# Patient Record
Sex: Female | Born: 1945 | Race: White | Hispanic: No | Marital: Married | Smoking: Never smoker
Health system: Southern US, Community
[De-identification: ages and names within clinical notes are randomized; demographics above are authoritative.]

## PROBLEM LIST (undated history)

## (undated) DIAGNOSIS — C799 Secondary malignant neoplasm of unspecified site: Secondary | ICD-10-CM

## (undated) DIAGNOSIS — C801 Malignant (primary) neoplasm, unspecified: Secondary | ICD-10-CM

## (undated) DIAGNOSIS — C23 Malignant neoplasm of gallbladder: Secondary | ICD-10-CM

## (undated) HISTORY — DX: Secondary malignant neoplasm of unspecified site: C79.9

## (undated) HISTORY — DX: Malignant neoplasm of gallbladder: C23

## (undated) HISTORY — DX: Malignant (primary) neoplasm, unspecified: C80.1

---

## 2005-04-29 DIAGNOSIS — C801 Malignant (primary) neoplasm, unspecified: Secondary | ICD-10-CM

## 2005-04-29 HISTORY — DX: Malignant (primary) neoplasm, unspecified: C80.1

## 2006-03-29 HISTORY — PX: CHOLECYSTECTOMY: SHX55

## 2006-04-15 ENCOUNTER — Inpatient Hospital Stay: Payer: Self-pay | Admitting: Vascular Surgery

## 2006-04-15 ENCOUNTER — Ambulatory Visit: Payer: Self-pay | Admitting: Family Medicine

## 2006-04-24 ENCOUNTER — Ambulatory Visit: Payer: Self-pay | Admitting: Internal Medicine

## 2006-05-09 ENCOUNTER — Ambulatory Visit: Payer: Self-pay | Admitting: Internal Medicine

## 2006-05-22 ENCOUNTER — Ambulatory Visit: Payer: Self-pay | Admitting: Internal Medicine

## 2006-12-10 ENCOUNTER — Ambulatory Visit: Payer: Self-pay | Admitting: Family Medicine

## 2007-12-29 ENCOUNTER — Ambulatory Visit: Payer: Self-pay | Admitting: Internal Medicine

## 2008-06-21 ENCOUNTER — Ambulatory Visit: Payer: Self-pay | Admitting: Family Medicine

## 2008-07-25 ENCOUNTER — Ambulatory Visit: Payer: Self-pay | Admitting: Neurology

## 2010-04-29 HISTORY — PX: HAND SURGERY: SHX662

## 2011-03-29 ENCOUNTER — Ambulatory Visit: Payer: Self-pay | Admitting: Unknown Physician Specialty

## 2011-03-29 DIAGNOSIS — Z01810 Encounter for preprocedural cardiovascular examination: Secondary | ICD-10-CM

## 2011-04-01 ENCOUNTER — Ambulatory Visit: Payer: Self-pay | Admitting: Unknown Physician Specialty

## 2011-04-03 LAB — PATHOLOGY REPORT

## 2011-04-04 ENCOUNTER — Ambulatory Visit: Payer: Self-pay | Admitting: Family Medicine

## 2011-04-30 HISTORY — PX: COLONOSCOPY: SHX174

## 2011-06-14 ENCOUNTER — Ambulatory Visit: Payer: Self-pay | Admitting: Unknown Physician Specialty

## 2012-04-07 ENCOUNTER — Ambulatory Visit: Payer: Self-pay | Admitting: Family Medicine

## 2013-01-05 ENCOUNTER — Ambulatory Visit: Payer: Self-pay | Admitting: Oncology

## 2013-01-06 ENCOUNTER — Ambulatory Visit: Payer: Self-pay | Admitting: Oncology

## 2013-01-06 LAB — COMPREHENSIVE METABOLIC PANEL
Anion Gap: 8 (ref 7–16)
BUN: 15 mg/dL (ref 7–18)
Bilirubin,Total: 0.3 mg/dL (ref 0.2–1.0)
Calcium, Total: 9.5 mg/dL (ref 8.5–10.1)
Chloride: 102 mmol/L (ref 98–107)
Co2: 29 mmol/L (ref 21–32)
Creatinine: 0.97 mg/dL (ref 0.60–1.30)
Glucose: 101 mg/dL — ABNORMAL HIGH (ref 65–99)
Potassium: 4.1 mmol/L (ref 3.5–5.1)
SGPT (ALT): 32 U/L (ref 12–78)
Sodium: 139 mmol/L (ref 136–145)
Total Protein: 8.2 g/dL (ref 6.4–8.2)

## 2013-01-06 LAB — CBC CANCER CENTER
Basophil %: 1.3 %
HCT: 39.4 % (ref 35.0–47.0)
HGB: 13.3 g/dL (ref 12.0–16.0)
MCV: 88 fL (ref 80–100)
Monocyte #: 0.6 x10 3/mm (ref 0.2–0.9)
Neutrophil #: 4.2 x10 3/mm (ref 1.4–6.5)
Platelet: 268 x10 3/mm (ref 150–440)
RBC: 4.47 10*6/uL (ref 3.80–5.20)
WBC: 6.2 x10 3/mm (ref 3.6–11.0)

## 2013-01-15 LAB — APTT: Activated PTT: 33.1 secs (ref 23.6–35.9)

## 2013-01-15 LAB — PROTIME-INR: INR: 0.9

## 2013-01-15 LAB — CANCER CTR PLATELET CT: Platelet: 237 x10 3/mm (ref 150–440)

## 2013-01-18 ENCOUNTER — Ambulatory Visit: Payer: Self-pay | Admitting: Oncology

## 2013-01-18 LAB — PROTIME-INR: Prothrombin Time: 12.4 secs (ref 11.5–14.7)

## 2013-01-18 LAB — APTT: Activated PTT: 32.4 secs (ref 23.6–35.9)

## 2013-01-21 LAB — PATHOLOGY REPORT

## 2013-01-27 ENCOUNTER — Ambulatory Visit: Payer: Self-pay | Admitting: Oncology

## 2013-01-27 HISTORY — PX: PORTACATH PLACEMENT: SHX2246

## 2013-01-29 ENCOUNTER — Ambulatory Visit: Payer: Self-pay | Admitting: Vascular Surgery

## 2013-02-11 LAB — CBC CANCER CENTER
Basophil #: 0 x10 3/mm (ref 0.0–0.1)
Basophil %: 1 %
Eosinophil %: 1.6 %
HCT: 40.1 % (ref 35.0–47.0)
HGB: 13.6 g/dL (ref 12.0–16.0)
Lymphocyte #: 1.1 x10 3/mm (ref 1.0–3.6)
Lymphocyte %: 24.4 %
MCHC: 33.9 g/dL (ref 32.0–36.0)
Neutrophil #: 2.7 x10 3/mm (ref 1.4–6.5)
Neutrophil %: 57.7 %
Platelet: 214 x10 3/mm (ref 150–440)
WBC: 4.6 x10 3/mm (ref 3.6–11.0)

## 2013-02-11 LAB — BASIC METABOLIC PANEL
Anion Gap: 8 (ref 7–16)
BUN: 8 mg/dL (ref 7–18)
Chloride: 103 mmol/L (ref 98–107)
Co2: 30 mmol/L (ref 21–32)
Creatinine: 0.88 mg/dL (ref 0.60–1.30)
EGFR (African American): >60
Osmolality: 279 (ref 275–301)
Potassium: 4.3 mmol/L (ref 3.5–5.1)
Sodium: 141 mmol/L (ref 136–145)

## 2013-02-26 LAB — CBC CANCER CENTER
Basophil #: 0.1 x10 3/mm (ref 0.0–0.1)
Eosinophil %: 1 %
HCT: 40.4 % (ref 35.0–47.0)
Lymphocyte #: 1 x10 3/mm (ref 1.0–3.6)
Lymphocyte %: 18.1 %
Monocyte %: 14.7 %
Neutrophil #: 3.5 x10 3/mm (ref 1.4–6.5)
Neutrophil %: 64.9 %
Platelet: 170 x10 3/mm (ref 150–440)
RDW: 14.6 % — ABNORMAL HIGH (ref 11.5–14.5)

## 2013-02-26 LAB — COMPREHENSIVE METABOLIC PANEL
Alkaline Phosphatase: 117 U/L (ref 50–136)
Anion Gap: 9 (ref 7–16)
BUN: 7 mg/dL (ref 7–18)
Bilirubin,Total: 0.4 mg/dL (ref 0.2–1.0)
Chloride: 104 mmol/L (ref 98–107)
Co2: 28 mmol/L (ref 21–32)
Creatinine: 0.72 mg/dL (ref 0.60–1.30)
EGFR (Non-African Amer.): >60
Glucose: 91 mg/dL (ref 65–99)
Osmolality: 279 (ref 275–301)
Potassium: 3.7 mmol/L (ref 3.5–5.1)
SGOT(AST): 38 U/L — ABNORMAL HIGH (ref 15–37)
SGPT (ALT): 56 U/L (ref 12–78)

## 2013-02-27 ENCOUNTER — Ambulatory Visit: Payer: Self-pay | Admitting: Oncology

## 2013-03-12 LAB — COMPREHENSIVE METABOLIC PANEL
Albumin: 3.8 g/dL (ref 3.4–5.0)
BUN: 8 mg/dL (ref 7–18)
Bilirubin,Total: 0.4 mg/dL (ref 0.2–1.0)
Creatinine: 0.77 mg/dL (ref 0.60–1.30)
EGFR (Non-African Amer.): >60
Osmolality: 279 (ref 275–301)
Total Protein: 7.6 g/dL (ref 6.4–8.2)

## 2013-03-12 LAB — CBC CANCER CENTER
Basophil #: 0 x10 3/mm (ref 0.0–0.1)
Eosinophil %: 1.5 %
HCT: 38.8 % (ref 35.0–47.0)
HGB: 12.9 g/dL (ref 12.0–16.0)
MCH: 29.6 pg (ref 26.0–34.0)
MCHC: 33.2 g/dL (ref 32.0–36.0)
Monocyte #: 0.9 x10 3/mm (ref 0.2–0.9)
Platelet: 138 x10 3/mm — ABNORMAL LOW (ref 150–440)
RBC: 4.35 10*6/uL (ref 3.80–5.20)
RDW: 15.1 % — ABNORMAL HIGH (ref 11.5–14.5)
WBC: 6.1 x10 3/mm (ref 3.6–11.0)

## 2013-03-29 ENCOUNTER — Ambulatory Visit: Payer: Self-pay | Admitting: Oncology

## 2013-04-02 LAB — CBC CANCER CENTER
Eosinophil %: 0.6 %
HCT: 37.1 % (ref 35.0–47.0)
Lymphocyte #: 0.5 x10 3/mm — ABNORMAL LOW (ref 1.0–3.6)
Lymphocyte %: 6.8 %
MCHC: 33.5 g/dL (ref 32.0–36.0)
MCV: 90 fL (ref 80–100)
Monocyte #: 1.1 x10 3/mm — ABNORMAL HIGH (ref 0.2–0.9)
Monocyte %: 16.2 %
Neutrophil #: 5.1 x10 3/mm (ref 1.4–6.5)
Platelet: 213 x10 3/mm (ref 150–440)
RDW: 16.1 % — ABNORMAL HIGH (ref 11.5–14.5)

## 2013-04-02 LAB — COMPREHENSIVE METABOLIC PANEL
BUN: 9 mg/dL (ref 7–18)
Calcium, Total: 9 mg/dL (ref 8.5–10.1)
Co2: 25 mmol/L (ref 21–32)
Creatinine: 0.7 mg/dL (ref 0.60–1.30)
EGFR (African American): >60
EGFR (Non-African Amer.): >60
Osmolality: 270 (ref 275–301)
Potassium: 3.6 mmol/L (ref 3.5–5.1)
SGPT (ALT): 42 U/L (ref 12–78)
Sodium: 136 mmol/L (ref 136–145)

## 2013-04-16 LAB — COMPREHENSIVE METABOLIC PANEL
Albumin: 3.5 g/dL (ref 3.4–5.0)
Anion Gap: 9 (ref 7–16)
Co2: 27 mmol/L (ref 21–32)
Creatinine: 0.59 mg/dL — ABNORMAL LOW (ref 0.60–1.30)
EGFR (Non-African Amer.): >60
Osmolality: 279 (ref 275–301)
Total Protein: 7.5 g/dL (ref 6.4–8.2)

## 2013-04-16 LAB — CBC CANCER CENTER
HCT: 35 % (ref 35.0–47.0)
Lymphocyte #: 0.6 x10 3/mm — ABNORMAL LOW (ref 1.0–3.6)
Lymphocyte %: 14.9 %
MCH: 30.4 pg (ref 26.0–34.0)
MCHC: 33.7 g/dL (ref 32.0–36.0)
Monocyte #: 0.8 x10 3/mm (ref 0.2–0.9)
Monocyte %: 18.1 %
Neutrophil #: 2.7 x10 3/mm (ref 1.4–6.5)
Neutrophil %: 64.2 %
Platelet: 85 x10 3/mm — ABNORMAL LOW (ref 150–440)
RBC: 3.87 10*6/uL (ref 3.80–5.20)
WBC: 4.2 x10 3/mm (ref 3.6–11.0)

## 2013-04-29 ENCOUNTER — Ambulatory Visit: Payer: Self-pay | Admitting: Oncology

## 2013-04-30 LAB — CBC CANCER CENTER
BASOS ABS: 0.1 x10 3/mm (ref 0.0–0.1)
Basophil %: 1.1 %
EOS ABS: 0.1 x10 3/mm (ref 0.0–0.7)
Eosinophil %: 1.6 %
HCT: 35.1 % (ref 35.0–47.0)
HGB: 11.8 g/dL — ABNORMAL LOW (ref 12.0–16.0)
Lymphocyte #: 0.8 x10 3/mm — ABNORMAL LOW (ref 1.0–3.6)
Lymphocyte %: 17.1 %
MCH: 30.2 pg (ref 26.0–34.0)
MCHC: 33.6 g/dL (ref 32.0–36.0)
MCV: 90 fL (ref 80–100)
MONO ABS: 0.9 x10 3/mm (ref 0.2–0.9)
Monocyte %: 18.7 %
Neutrophil #: 2.9 x10 3/mm (ref 1.4–6.5)
Neutrophil %: 61.5 %
Platelet: 120 x10 3/mm — ABNORMAL LOW (ref 150–440)
RBC: 3.9 10*6/uL (ref 3.80–5.20)
RDW: 15.6 % — ABNORMAL HIGH (ref 11.5–14.5)
WBC: 4.7 x10 3/mm (ref 3.6–11.0)

## 2013-04-30 LAB — COMPREHENSIVE METABOLIC PANEL
Albumin: 3.6 g/dL (ref 3.4–5.0)
Alkaline Phosphatase: 144 U/L — ABNORMAL HIGH
Anion Gap: 11 (ref 7–16)
BUN: 7 mg/dL (ref 7–18)
Bilirubin,Total: 0.4 mg/dL (ref 0.2–1.0)
CALCIUM: 9.2 mg/dL (ref 8.5–10.1)
CO2: 25 mmol/L (ref 21–32)
Chloride: 104 mmol/L (ref 98–107)
Creatinine: 0.63 mg/dL (ref 0.60–1.30)
EGFR (Non-African Amer.): >60
Glucose: 94 mg/dL (ref 65–99)
OSMOLALITY: 277 (ref 275–301)
Potassium: 3.4 mmol/L — ABNORMAL LOW (ref 3.5–5.1)
SGOT(AST): 36 U/L (ref 15–37)
SGPT (ALT): 59 U/L (ref 12–78)
SODIUM: 140 mmol/L (ref 136–145)
Total Protein: 7.5 g/dL (ref 6.4–8.2)

## 2013-05-14 LAB — CBC CANCER CENTER
BASOS ABS: 0.1 x10 3/mm (ref 0.0–0.1)
Basophil %: 1.3 %
Eosinophil #: 0.1 x10 3/mm (ref 0.0–0.7)
Eosinophil %: 2 %
HCT: 35.6 % (ref 35.0–47.0)
HGB: 11.9 g/dL — AB (ref 12.0–16.0)
LYMPHS ABS: 1.1 x10 3/mm (ref 1.0–3.6)
Lymphocyte %: 21.4 %
MCH: 30.2 pg (ref 26.0–34.0)
MCHC: 33.5 g/dL (ref 32.0–36.0)
MCV: 90 fL (ref 80–100)
MONOS PCT: 24.4 %
Monocyte #: 1.2 x10 3/mm — ABNORMAL HIGH (ref 0.2–0.9)
NEUTROS PCT: 50.9 %
Neutrophil #: 2.6 x10 3/mm (ref 1.4–6.5)
PLATELETS: 130 x10 3/mm — AB (ref 150–440)
RBC: 3.95 10*6/uL (ref 3.80–5.20)
RDW: 15.7 % — AB (ref 11.5–14.5)
WBC: 5.1 x10 3/mm (ref 3.6–11.0)

## 2013-05-14 LAB — COMPREHENSIVE METABOLIC PANEL
ALBUMIN: 3.7 g/dL (ref 3.4–5.0)
AST: 43 U/L — AB (ref 15–37)
Alkaline Phosphatase: 149 U/L — ABNORMAL HIGH
Anion Gap: 10 (ref 7–16)
BUN: 6 mg/dL — ABNORMAL LOW (ref 7–18)
Bilirubin,Total: 0.5 mg/dL (ref 0.2–1.0)
CHLORIDE: 102 mmol/L (ref 98–107)
CO2: 26 mmol/L (ref 21–32)
CREATININE: 0.75 mg/dL (ref 0.60–1.30)
Calcium, Total: 9.5 mg/dL (ref 8.5–10.1)
EGFR (African American): >60
EGFR (Non-African Amer.): >60
Glucose: 96 mg/dL (ref 65–99)
Osmolality: 273 (ref 275–301)
POTASSIUM: 3.7 mmol/L (ref 3.5–5.1)
SGPT (ALT): 49 U/L (ref 12–78)
Sodium: 138 mmol/L (ref 136–145)
Total Protein: 7.9 g/dL (ref 6.4–8.2)

## 2013-05-15 LAB — CANCER ANTIGEN 19-9: CA 19-9: 88 U/mL — ABNORMAL HIGH (ref 0–35)

## 2013-05-28 LAB — CBC CANCER CENTER
Basophil #: 0 x10 3/mm (ref 0.0–0.1)
Basophil %: 0.8 %
EOS PCT: 2.2 %
Eosinophil #: 0.1 x10 3/mm (ref 0.0–0.7)
HCT: 35.2 % (ref 35.0–47.0)
HGB: 11.7 g/dL — AB (ref 12.0–16.0)
LYMPHS PCT: 25.5 %
Lymphocyte #: 0.9 x10 3/mm — ABNORMAL LOW (ref 1.0–3.6)
MCH: 30.2 pg (ref 26.0–34.0)
MCHC: 33.1 g/dL (ref 32.0–36.0)
MCV: 91 fL (ref 80–100)
MONO ABS: 0.9 x10 3/mm (ref 0.2–0.9)
Monocyte %: 23 %
Neutrophil #: 1.8 x10 3/mm (ref 1.4–6.5)
Neutrophil %: 48.5 %
Platelet: 113 x10 3/mm — ABNORMAL LOW (ref 150–440)
RBC: 3.87 10*6/uL (ref 3.80–5.20)
RDW: 15.5 % — AB (ref 11.5–14.5)
WBC: 3.7 x10 3/mm (ref 3.6–11.0)

## 2013-05-28 LAB — COMPREHENSIVE METABOLIC PANEL
ALBUMIN: 3.7 g/dL (ref 3.4–5.0)
ALK PHOS: 141 U/L — AB
ANION GAP: 12 (ref 7–16)
AST: 35 U/L (ref 15–37)
BUN: 6 mg/dL — AB (ref 7–18)
Bilirubin,Total: 0.4 mg/dL (ref 0.2–1.0)
CALCIUM: 9.4 mg/dL (ref 8.5–10.1)
CHLORIDE: 104 mmol/L (ref 98–107)
Co2: 25 mmol/L (ref 21–32)
Creatinine: 0.71 mg/dL (ref 0.60–1.30)
EGFR (African American): >60
EGFR (Non-African Amer.): >60
GLUCOSE: 97 mg/dL (ref 65–99)
Osmolality: 279 (ref 275–301)
Potassium: 3.5 mmol/L (ref 3.5–5.1)
SGPT (ALT): 43 U/L (ref 12–78)
SODIUM: 141 mmol/L (ref 136–145)
Total Protein: 7.8 g/dL (ref 6.4–8.2)

## 2013-05-29 LAB — CANCER ANTIGEN 19-9: CA 19 9: 77 U/mL — AB (ref 0–35)

## 2013-05-30 ENCOUNTER — Ambulatory Visit: Payer: Self-pay | Admitting: Oncology

## 2013-06-11 LAB — COMPREHENSIVE METABOLIC PANEL
ALT: 46 U/L (ref 12–78)
AST: 38 U/L — AB (ref 15–37)
Albumin: 3.6 g/dL (ref 3.4–5.0)
Alkaline Phosphatase: 129 U/L — ABNORMAL HIGH
Anion Gap: 10 (ref 7–16)
BILIRUBIN TOTAL: 0.3 mg/dL (ref 0.2–1.0)
BUN: 6 mg/dL — ABNORMAL LOW (ref 7–18)
CHLORIDE: 106 mmol/L (ref 98–107)
CO2: 26 mmol/L (ref 21–32)
CREATININE: 0.69 mg/dL (ref 0.60–1.30)
Calcium, Total: 9 mg/dL (ref 8.5–10.1)
EGFR (African American): >60
GLUCOSE: 94 mg/dL (ref 65–99)
Osmolality: 280 (ref 275–301)
POTASSIUM: 3.6 mmol/L (ref 3.5–5.1)
Sodium: 142 mmol/L (ref 136–145)
Total Protein: 7.9 g/dL (ref 6.4–8.2)

## 2013-06-11 LAB — CBC CANCER CENTER
Basophil #: 0 x10 3/mm (ref 0.0–0.1)
Basophil %: 1 %
EOS ABS: 0.1 x10 3/mm (ref 0.0–0.7)
Eosinophil %: 1.5 %
HCT: 34.8 % — AB (ref 35.0–47.0)
HGB: 11.6 g/dL — ABNORMAL LOW (ref 12.0–16.0)
LYMPHS ABS: 0.8 x10 3/mm — AB (ref 1.0–3.6)
Lymphocyte %: 17.9 %
MCH: 30.1 pg (ref 26.0–34.0)
MCHC: 33.3 g/dL (ref 32.0–36.0)
MCV: 90 fL (ref 80–100)
Monocyte #: 0.9 x10 3/mm (ref 0.2–0.9)
Monocyte %: 18.6 %
Neutrophil #: 2.9 x10 3/mm (ref 1.4–6.5)
Neutrophil %: 61 %
PLATELETS: 103 x10 3/mm — AB (ref 150–440)
RBC: 3.85 10*6/uL (ref 3.80–5.20)
RDW: 15.8 % — ABNORMAL HIGH (ref 11.5–14.5)
WBC: 4.8 x10 3/mm (ref 3.6–11.0)

## 2013-06-25 LAB — CBC CANCER CENTER
BASOS ABS: 0.1 x10 3/mm (ref 0.0–0.1)
Basophil %: 1.1 %
EOS PCT: 1.5 %
Eosinophil #: 0.1 x10 3/mm (ref 0.0–0.7)
HCT: 36.1 % (ref 35.0–47.0)
HGB: 11.7 g/dL — AB (ref 12.0–16.0)
Lymphocyte #: 1 x10 3/mm (ref 1.0–3.6)
Lymphocyte %: 21.3 %
MCH: 29.6 pg (ref 26.0–34.0)
MCHC: 32.4 g/dL (ref 32.0–36.0)
MCV: 91 fL (ref 80–100)
MONO ABS: 0.9 x10 3/mm (ref 0.2–0.9)
Monocyte %: 18.5 %
NEUTROS PCT: 57.6 %
Neutrophil #: 2.7 x10 3/mm (ref 1.4–6.5)
Platelet: 142 x10 3/mm — ABNORMAL LOW (ref 150–440)
RBC: 3.94 10*6/uL (ref 3.80–5.20)
RDW: 15.9 % — ABNORMAL HIGH (ref 11.5–14.5)
WBC: 4.8 x10 3/mm (ref 3.6–11.0)

## 2013-06-25 LAB — COMPREHENSIVE METABOLIC PANEL
ALBUMIN: 3.5 g/dL (ref 3.4–5.0)
ANION GAP: 13 (ref 7–16)
Alkaline Phosphatase: 163 U/L — ABNORMAL HIGH
BILIRUBIN TOTAL: 0.3 mg/dL (ref 0.2–1.0)
BUN: 7 mg/dL (ref 7–18)
CALCIUM: 9.1 mg/dL (ref 8.5–10.1)
Chloride: 104 mmol/L (ref 98–107)
Co2: 23 mmol/L (ref 21–32)
Creatinine: 0.8 mg/dL (ref 0.60–1.30)
EGFR (African American): >60
GLUCOSE: 106 mg/dL — AB (ref 65–99)
Osmolality: 278 (ref 275–301)
Potassium: 3.7 mmol/L (ref 3.5–5.1)
SGOT(AST): 38 U/L — ABNORMAL HIGH (ref 15–37)
SGPT (ALT): 40 U/L (ref 12–78)
Sodium: 140 mmol/L (ref 136–145)
Total Protein: 8.4 g/dL — ABNORMAL HIGH (ref 6.4–8.2)

## 2013-06-27 ENCOUNTER — Ambulatory Visit: Payer: Self-pay | Admitting: Oncology

## 2013-07-09 LAB — CBC CANCER CENTER
BASOS ABS: 0 x10 3/mm (ref 0.0–0.1)
Basophil %: 0.9 %
EOS PCT: 1.4 %
Eosinophil #: 0.1 x10 3/mm (ref 0.0–0.7)
HCT: 36 % (ref 35.0–47.0)
HGB: 11.8 g/dL — ABNORMAL LOW (ref 12.0–16.0)
Lymphocyte #: 1 x10 3/mm (ref 1.0–3.6)
Lymphocyte %: 18.6 %
MCH: 29.8 pg (ref 26.0–34.0)
MCHC: 32.9 g/dL (ref 32.0–36.0)
MCV: 90 fL (ref 80–100)
MONOS PCT: 27.5 %
Monocyte #: 1.5 x10 3/mm — ABNORMAL HIGH (ref 0.2–0.9)
NEUTROS ABS: 2.7 x10 3/mm (ref 1.4–6.5)
NEUTROS PCT: 51.6 %
PLATELETS: 118 x10 3/mm — AB (ref 150–440)
RBC: 3.98 10*6/uL (ref 3.80–5.20)
RDW: 16.6 % — ABNORMAL HIGH (ref 11.5–14.5)
WBC: 5.3 x10 3/mm (ref 3.6–11.0)

## 2013-07-09 LAB — COMPREHENSIVE METABOLIC PANEL
ALT: 51 U/L (ref 12–78)
ANION GAP: 9 (ref 7–16)
Albumin: 3.6 g/dL (ref 3.4–5.0)
Alkaline Phosphatase: 158 U/L — ABNORMAL HIGH
BILIRUBIN TOTAL: 0.5 mg/dL (ref 0.2–1.0)
BUN: 6 mg/dL — ABNORMAL LOW (ref 7–18)
CHLORIDE: 101 mmol/L (ref 98–107)
CREATININE: 0.73 mg/dL (ref 0.60–1.30)
Calcium, Total: 8.9 mg/dL (ref 8.5–10.1)
Co2: 26 mmol/L (ref 21–32)
EGFR (African American): >60
EGFR (Non-African Amer.): >60
Glucose: 98 mg/dL (ref 65–99)
OSMOLALITY: 270 (ref 275–301)
Potassium: 3.4 mmol/L — ABNORMAL LOW (ref 3.5–5.1)
SGOT(AST): 47 U/L — ABNORMAL HIGH (ref 15–37)
Sodium: 136 mmol/L (ref 136–145)
Total Protein: 8.1 g/dL (ref 6.4–8.2)

## 2013-07-23 LAB — COMPREHENSIVE METABOLIC PANEL
ALK PHOS: 158 U/L — AB
AST: 42 U/L — AB (ref 15–37)
Albumin: 3.4 g/dL (ref 3.4–5.0)
Anion Gap: 10 (ref 7–16)
BILIRUBIN TOTAL: 0.3 mg/dL (ref 0.2–1.0)
BUN: 6 mg/dL — AB (ref 7–18)
CALCIUM: 9.1 mg/dL (ref 8.5–10.1)
Chloride: 103 mmol/L (ref 98–107)
Co2: 27 mmol/L (ref 21–32)
Creatinine: 0.78 mg/dL (ref 0.60–1.30)
EGFR (African American): >60
EGFR (Non-African Amer.): >60
Glucose: 105 mg/dL — ABNORMAL HIGH (ref 65–99)
Osmolality: 277 (ref 275–301)
POTASSIUM: 3.4 mmol/L — AB (ref 3.5–5.1)
SGPT (ALT): 44 U/L (ref 12–78)
Sodium: 140 mmol/L (ref 136–145)
Total Protein: 7.9 g/dL (ref 6.4–8.2)

## 2013-07-23 LAB — CBC CANCER CENTER
Basophil #: 0 x10 3/mm (ref 0.0–0.1)
Basophil %: 1.2 %
EOS PCT: 2 %
Eosinophil #: 0.1 x10 3/mm (ref 0.0–0.7)
HCT: 34.3 % — ABNORMAL LOW (ref 35.0–47.0)
HGB: 11.4 g/dL — ABNORMAL LOW (ref 12.0–16.0)
LYMPHS ABS: 0.8 x10 3/mm — AB (ref 1.0–3.6)
LYMPHS PCT: 22.8 %
MCH: 30.1 pg (ref 26.0–34.0)
MCHC: 33.3 g/dL (ref 32.0–36.0)
MCV: 90 fL (ref 80–100)
MONO ABS: 0.9 x10 3/mm (ref 0.2–0.9)
Monocyte %: 25.9 %
Neutrophil #: 1.6 x10 3/mm (ref 1.4–6.5)
Neutrophil %: 48.1 %
PLATELETS: 92 x10 3/mm — AB (ref 150–440)
RBC: 3.8 10*6/uL (ref 3.80–5.20)
RDW: 16.3 % — AB (ref 11.5–14.5)
WBC: 3.3 x10 3/mm — ABNORMAL LOW (ref 3.6–11.0)

## 2013-07-24 LAB — CANCER ANTIGEN 19-9: CA 19 9: 98 U/mL — AB (ref 0–35)

## 2013-07-28 ENCOUNTER — Ambulatory Visit: Payer: Self-pay | Admitting: Oncology

## 2013-08-06 LAB — COMPREHENSIVE METABOLIC PANEL WITH GFR
Albumin: 3.3 g/dL — ABNORMAL LOW
Alkaline Phosphatase: 152 U/L — ABNORMAL HIGH
Anion Gap: 10
BUN: 8 mg/dL
Bilirubin,Total: 0.5 mg/dL
Calcium, Total: 9 mg/dL
Chloride: 104 mmol/L
Co2: 26 mmol/L
Creatinine: 0.67 mg/dL
EGFR (African American): >60
EGFR (Non-African Amer.): >60
Glucose: 99 mg/dL
Osmolality: 278
Potassium: 3.4 mmol/L — ABNORMAL LOW
SGOT(AST): 39 U/L — ABNORMAL HIGH
SGPT (ALT): 39 U/L
Sodium: 140 mmol/L
Total Protein: 7.8 g/dL

## 2013-08-06 LAB — CBC CANCER CENTER
Basophil #: 0.1 10*3/uL
Basophil %: 1.6 %
Eosinophil #: 0.1 10*3/uL
Eosinophil %: 1.2 %
HCT: 32.9 % — ABNORMAL LOW
HGB: 11.1 g/dL — ABNORMAL LOW
Lymphocyte %: 13.4 %
Lymphs Abs: 0.6 10*3/uL — ABNORMAL LOW
MCH: 30.2 pg
MCHC: 33.6 g/dL
MCV: 90 fL
Monocyte #: 0.8 10*3/uL
Monocyte %: 18.8 %
Neutrophil #: 2.9 10*3/uL
Neutrophil %: 65 %
Platelet: 92 10*3/uL — ABNORMAL LOW
RBC: 3.67 10*6/uL — ABNORMAL LOW
RDW: 16.5 % — ABNORMAL HIGH
WBC: 4.4 10*3/uL

## 2013-08-07 LAB — CANCER ANTIGEN 19-9: CA 19 9: 82 U/mL — AB (ref 0–35)

## 2013-08-20 LAB — CBC CANCER CENTER
BASOS ABS: 0 x10 3/mm (ref 0.0–0.1)
Basophil %: 0.9 %
EOS PCT: 0.8 %
Eosinophil #: 0 x10 3/mm (ref 0.0–0.7)
HCT: 33.6 % — ABNORMAL LOW (ref 35.0–47.0)
HGB: 11.2 g/dL — ABNORMAL LOW (ref 12.0–16.0)
Lymphocyte #: 0.6 x10 3/mm — ABNORMAL LOW (ref 1.0–3.6)
Lymphocyte %: 12.6 %
MCH: 30.1 pg (ref 26.0–34.0)
MCHC: 33.2 g/dL (ref 32.0–36.0)
MCV: 91 fL (ref 80–100)
MONO ABS: 1.5 x10 3/mm — AB (ref 0.2–0.9)
Monocyte %: 29.8 %
NEUTROS PCT: 55.9 %
Neutrophil #: 2.7 x10 3/mm (ref 1.4–6.5)
PLATELETS: 94 x10 3/mm — AB (ref 150–440)
RBC: 3.71 10*6/uL — ABNORMAL LOW (ref 3.80–5.20)
RDW: 16.7 % — AB (ref 11.5–14.5)
WBC: 4.9 x10 3/mm (ref 3.6–11.0)

## 2013-08-27 ENCOUNTER — Ambulatory Visit: Payer: Self-pay | Admitting: Oncology

## 2013-09-03 LAB — COMPREHENSIVE METABOLIC PANEL
ALBUMIN: 3.4 g/dL (ref 3.4–5.0)
ALT: 40 U/L (ref 12–78)
AST: 39 U/L — AB (ref 15–37)
Alkaline Phosphatase: 169 U/L — ABNORMAL HIGH
Anion Gap: 9 (ref 7–16)
BUN: 7 mg/dL (ref 7–18)
Bilirubin,Total: 0.4 mg/dL (ref 0.2–1.0)
CALCIUM: 8.8 mg/dL (ref 8.5–10.1)
CHLORIDE: 103 mmol/L (ref 98–107)
Co2: 27 mmol/L (ref 21–32)
Creatinine: 0.76 mg/dL (ref 0.60–1.30)
EGFR (African American): >60
EGFR (Non-African Amer.): >60
Glucose: 109 mg/dL — ABNORMAL HIGH (ref 65–99)
OSMOLALITY: 276 (ref 275–301)
Potassium: 3.7 mmol/L (ref 3.5–5.1)
SODIUM: 139 mmol/L (ref 136–145)
TOTAL PROTEIN: 7.9 g/dL (ref 6.4–8.2)

## 2013-09-03 LAB — CBC CANCER CENTER
BASOS ABS: 0 x10 3/mm (ref 0.0–0.1)
Basophil %: 0.9 %
Eosinophil #: 0.1 x10 3/mm (ref 0.0–0.7)
Eosinophil %: 1.9 %
HCT: 33.5 % — ABNORMAL LOW (ref 35.0–47.0)
HGB: 11.1 g/dL — ABNORMAL LOW (ref 12.0–16.0)
LYMPHS PCT: 14.8 %
Lymphocyte #: 0.7 x10 3/mm — ABNORMAL LOW (ref 1.0–3.6)
MCH: 30.2 pg (ref 26.0–34.0)
MCHC: 33 g/dL (ref 32.0–36.0)
MCV: 92 fL (ref 80–100)
MONO ABS: 0.8 x10 3/mm (ref 0.2–0.9)
Monocyte %: 18.6 %
NEUTROS PCT: 63.8 %
Neutrophil #: 2.9 x10 3/mm (ref 1.4–6.5)
Platelet: 129 x10 3/mm — ABNORMAL LOW (ref 150–440)
RBC: 3.66 10*6/uL — ABNORMAL LOW (ref 3.80–5.20)
RDW: 16.8 % — AB (ref 11.5–14.5)
WBC: 4.5 x10 3/mm (ref 3.6–11.0)

## 2013-09-06 LAB — CANCER ANTIGEN 19-9: CA 19-9: 159 U/mL — ABNORMAL HIGH (ref 0–35)

## 2013-09-17 LAB — CBC CANCER CENTER
BASOS PCT: 0.9 %
Basophil #: 0 x10 3/mm (ref 0.0–0.1)
EOS PCT: 2.4 %
Eosinophil #: 0.1 x10 3/mm (ref 0.0–0.7)
HCT: 32.6 % — ABNORMAL LOW (ref 35.0–47.0)
HGB: 10.9 g/dL — ABNORMAL LOW (ref 12.0–16.0)
Lymphocyte #: 0.6 x10 3/mm — ABNORMAL LOW (ref 1.0–3.6)
Lymphocyte %: 19.8 %
MCH: 30.4 pg (ref 26.0–34.0)
MCHC: 33.6 g/dL (ref 32.0–36.0)
MCV: 91 fL (ref 80–100)
Monocyte #: 0.7 x10 3/mm (ref 0.2–0.9)
Monocyte %: 22.4 %
Neutrophil #: 1.6 x10 3/mm (ref 1.4–6.5)
Neutrophil %: 54.5 %
PLATELETS: 95 x10 3/mm — AB (ref 150–440)
RBC: 3.6 10*6/uL — ABNORMAL LOW (ref 3.80–5.20)
RDW: 16.6 % — ABNORMAL HIGH (ref 11.5–14.5)
WBC: 3 x10 3/mm — ABNORMAL LOW (ref 3.6–11.0)

## 2013-09-27 ENCOUNTER — Ambulatory Visit: Payer: Self-pay | Admitting: Oncology

## 2013-10-01 LAB — COMPREHENSIVE METABOLIC PANEL
ALBUMIN: 3.3 g/dL — AB (ref 3.4–5.0)
Alkaline Phosphatase: 168 U/L — ABNORMAL HIGH
Anion Gap: 8 (ref 7–16)
BILIRUBIN TOTAL: 0.4 mg/dL (ref 0.2–1.0)
BUN: 7 mg/dL (ref 7–18)
CHLORIDE: 103 mmol/L (ref 98–107)
Calcium, Total: 8.9 mg/dL (ref 8.5–10.1)
Co2: 26 mmol/L (ref 21–32)
Creatinine: 0.67 mg/dL (ref 0.60–1.30)
EGFR (African American): >60
GLUCOSE: 98 mg/dL (ref 65–99)
OSMOLALITY: 272 (ref 275–301)
Potassium: 3.4 mmol/L — ABNORMAL LOW (ref 3.5–5.1)
SGOT(AST): 41 U/L — ABNORMAL HIGH (ref 15–37)
SGPT (ALT): 43 U/L (ref 12–78)
Sodium: 137 mmol/L (ref 136–145)
TOTAL PROTEIN: 7.7 g/dL (ref 6.4–8.2)

## 2013-10-01 LAB — CBC CANCER CENTER
BASOS PCT: 0.9 %
Basophil #: 0 x10 3/mm (ref 0.0–0.1)
Eosinophil #: 0.1 x10 3/mm (ref 0.0–0.7)
Eosinophil %: 2.8 %
HCT: 32.6 % — ABNORMAL LOW (ref 35.0–47.0)
HGB: 10.9 g/dL — ABNORMAL LOW (ref 12.0–16.0)
LYMPHS ABS: 0.7 x10 3/mm — AB (ref 1.0–3.6)
Lymphocyte %: 19.4 %
MCH: 30.9 pg (ref 26.0–34.0)
MCHC: 33.3 g/dL (ref 32.0–36.0)
MCV: 93 fL (ref 80–100)
Monocyte #: 0.6 x10 3/mm (ref 0.2–0.9)
Monocyte %: 17.3 %
NEUTROS PCT: 59.6 %
Neutrophil #: 2 x10 3/mm (ref 1.4–6.5)
Platelet: 135 x10 3/mm — ABNORMAL LOW (ref 150–440)
RBC: 3.52 10*6/uL — AB (ref 3.80–5.20)
RDW: 17 % — ABNORMAL HIGH (ref 11.5–14.5)
WBC: 3.4 x10 3/mm — ABNORMAL LOW (ref 3.6–11.0)

## 2013-10-04 LAB — CANCER ANTIGEN 19-9: CA 19-9: 234 U/mL — ABNORMAL HIGH (ref 0–35)

## 2013-10-15 LAB — CBC CANCER CENTER
BASOS ABS: 0 x10 3/mm (ref 0.0–0.1)
Basophil %: 0.9 %
Eosinophil #: 0.1 x10 3/mm (ref 0.0–0.7)
Eosinophil %: 2.4 %
HCT: 32.2 % — ABNORMAL LOW (ref 35.0–47.0)
HGB: 10.5 g/dL — ABNORMAL LOW (ref 12.0–16.0)
Lymphocyte #: 0.7 x10 3/mm — ABNORMAL LOW (ref 1.0–3.6)
Lymphocyte %: 18.2 %
MCH: 30.1 pg (ref 26.0–34.0)
MCHC: 32.6 g/dL (ref 32.0–36.0)
MCV: 92 fL (ref 80–100)
MONO ABS: 1 x10 3/mm — AB (ref 0.2–0.9)
MONOS PCT: 23.7 %
NEUTROS ABS: 2.2 x10 3/mm (ref 1.4–6.5)
Neutrophil %: 54.8 %
Platelet: 136 x10 3/mm — ABNORMAL LOW (ref 150–440)
RBC: 3.49 10*6/uL — AB (ref 3.80–5.20)
RDW: 16.9 % — ABNORMAL HIGH (ref 11.5–14.5)
WBC: 4 x10 3/mm (ref 3.6–11.0)

## 2013-10-27 ENCOUNTER — Ambulatory Visit: Payer: Self-pay | Admitting: Oncology

## 2013-11-08 LAB — COMPREHENSIVE METABOLIC PANEL
ALBUMIN: 3.4 g/dL (ref 3.4–5.0)
ALT: 26 U/L (ref 12–78)
ANION GAP: 9 (ref 7–16)
Alkaline Phosphatase: 141 U/L — ABNORMAL HIGH
BILIRUBIN TOTAL: 0.5 mg/dL (ref 0.2–1.0)
BUN: 7 mg/dL (ref 7–18)
Calcium, Total: 9.1 mg/dL (ref 8.5–10.1)
Chloride: 104 mmol/L (ref 98–107)
Co2: 26 mmol/L (ref 21–32)
Creatinine: 0.75 mg/dL (ref 0.60–1.30)
EGFR (African American): >60
EGFR (Non-African Amer.): >60
Glucose: 105 mg/dL — ABNORMAL HIGH (ref 65–99)
Osmolality: 276 (ref 275–301)
Potassium: 3.5 mmol/L (ref 3.5–5.1)
SGOT(AST): 29 U/L (ref 15–37)
SODIUM: 139 mmol/L (ref 136–145)
Total Protein: 7.9 g/dL (ref 6.4–8.2)

## 2013-11-08 LAB — CBC CANCER CENTER
BASOS ABS: 0 x10 3/mm (ref 0.0–0.1)
BASOS PCT: 0.9 %
Eosinophil #: 0.1 x10 3/mm (ref 0.0–0.7)
Eosinophil %: 3.1 %
HCT: 34.3 % — ABNORMAL LOW (ref 35.0–47.0)
HGB: 11.3 g/dL — AB (ref 12.0–16.0)
Lymphocyte #: 0.8 x10 3/mm — ABNORMAL LOW (ref 1.0–3.6)
Lymphocyte %: 16.7 %
MCH: 31 pg (ref 26.0–34.0)
MCHC: 33 g/dL (ref 32.0–36.0)
MCV: 94 fL (ref 80–100)
Monocyte #: 0.9 x10 3/mm (ref 0.2–0.9)
Monocyte %: 19.8 %
Neutrophil #: 2.7 x10 3/mm (ref 1.4–6.5)
Neutrophil %: 59.5 %
PLATELETS: 177 x10 3/mm (ref 150–440)
RBC: 3.65 10*6/uL — AB (ref 3.80–5.20)
RDW: 16.3 % — AB (ref 11.5–14.5)
WBC: 4.5 x10 3/mm (ref 3.6–11.0)

## 2013-11-09 LAB — CANCER ANTIGEN 19-9: CA 19-9: 141 U/mL — ABNORMAL HIGH (ref 0–35)

## 2013-11-22 LAB — COMPREHENSIVE METABOLIC PANEL
AST: 38 U/L — AB (ref 15–37)
Albumin: 3.4 g/dL (ref 3.4–5.0)
Alkaline Phosphatase: 175 U/L — ABNORMAL HIGH
Anion Gap: 8 (ref 7–16)
BILIRUBIN TOTAL: 0.3 mg/dL (ref 0.2–1.0)
BUN: 12 mg/dL (ref 7–18)
CREATININE: 0.9 mg/dL (ref 0.60–1.30)
Calcium, Total: 9.3 mg/dL (ref 8.5–10.1)
Chloride: 102 mmol/L (ref 98–107)
Co2: 28 mmol/L (ref 21–32)
EGFR (African American): >60
GLUCOSE: 100 mg/dL — AB (ref 65–99)
Osmolality: 276 (ref 275–301)
Potassium: 3.8 mmol/L (ref 3.5–5.1)
SGPT (ALT): 36 U/L
Sodium: 138 mmol/L (ref 136–145)
Total Protein: 7.9 g/dL (ref 6.4–8.2)

## 2013-11-22 LAB — CBC CANCER CENTER
Basophil #: 0 x10 3/mm (ref 0.0–0.1)
Basophil %: 1.2 %
EOS ABS: 0.1 x10 3/mm (ref 0.0–0.7)
EOS PCT: 3.6 %
HCT: 34.9 % — ABNORMAL LOW (ref 35.0–47.0)
HGB: 11.5 g/dL — ABNORMAL LOW (ref 12.0–16.0)
Lymphocyte #: 0.9 x10 3/mm — ABNORMAL LOW (ref 1.0–3.6)
Lymphocyte %: 21.9 %
MCH: 30.3 pg (ref 26.0–34.0)
MCHC: 32.8 g/dL (ref 32.0–36.0)
MCV: 92 fL (ref 80–100)
MONO ABS: 0.9 x10 3/mm (ref 0.2–0.9)
MONOS PCT: 21.4 %
NEUTROS ABS: 2.1 x10 3/mm (ref 1.4–6.5)
Neutrophil %: 51.9 %
PLATELETS: 115 x10 3/mm — AB (ref 150–440)
RBC: 3.78 10*6/uL — ABNORMAL LOW (ref 3.80–5.20)
RDW: 16 % — AB (ref 11.5–14.5)
WBC: 4 x10 3/mm (ref 3.6–11.0)

## 2013-11-23 LAB — CANCER ANTIGEN 19-9: CA 19 9: 259 U/mL — AB (ref 0–35)

## 2013-11-27 ENCOUNTER — Ambulatory Visit: Payer: Self-pay | Admitting: Oncology

## 2013-12-10 LAB — CBC CANCER CENTER
BASOS PCT: 0.8 %
Basophil #: 0 x10 3/mm (ref 0.0–0.1)
Eosinophil #: 0.1 x10 3/mm (ref 0.0–0.7)
Eosinophil %: 1.7 %
HCT: 34.7 % — ABNORMAL LOW (ref 35.0–47.0)
HGB: 11.5 g/dL — ABNORMAL LOW (ref 12.0–16.0)
LYMPHS PCT: 19 %
Lymphocyte #: 0.8 x10 3/mm — ABNORMAL LOW (ref 1.0–3.6)
MCH: 30.5 pg (ref 26.0–34.0)
MCHC: 33 g/dL (ref 32.0–36.0)
MCV: 92 fL (ref 80–100)
MONO ABS: 0.9 x10 3/mm (ref 0.2–0.9)
MONOS PCT: 21 %
NEUTROS ABS: 2.3 x10 3/mm (ref 1.4–6.5)
Neutrophil %: 57.5 %
Platelet: 204 x10 3/mm (ref 150–440)
RBC: 3.76 10*6/uL — ABNORMAL LOW (ref 3.80–5.20)
RDW: 15.8 % — ABNORMAL HIGH (ref 11.5–14.5)
WBC: 4.1 x10 3/mm (ref 3.6–11.0)

## 2013-12-10 LAB — COMPREHENSIVE METABOLIC PANEL
ALT: 33 U/L
ANION GAP: 8 (ref 7–16)
Albumin: 3.5 g/dL (ref 3.4–5.0)
Alkaline Phosphatase: 170 U/L — ABNORMAL HIGH
BUN: 8 mg/dL (ref 7–18)
Bilirubin,Total: 0.5 mg/dL (ref 0.2–1.0)
CHLORIDE: 103 mmol/L (ref 98–107)
Calcium, Total: 9.3 mg/dL (ref 8.5–10.1)
Co2: 28 mmol/L (ref 21–32)
Creatinine: 0.84 mg/dL (ref 0.60–1.30)
EGFR (African American): >60
Glucose: 88 mg/dL (ref 65–99)
Osmolality: 275 (ref 275–301)
POTASSIUM: 3.8 mmol/L (ref 3.5–5.1)
SGOT(AST): 35 U/L (ref 15–37)
SODIUM: 139 mmol/L (ref 136–145)
Total Protein: 8.7 g/dL — ABNORMAL HIGH (ref 6.4–8.2)

## 2013-12-13 LAB — CANCER ANTIGEN 19-9: CA 19 9: 222 U/mL — AB (ref 0–35)

## 2013-12-15 ENCOUNTER — Ambulatory Visit: Payer: Self-pay | Admitting: Oncology

## 2013-12-28 ENCOUNTER — Ambulatory Visit: Payer: Self-pay | Admitting: Oncology

## 2014-01-07 ENCOUNTER — Ambulatory Visit: Payer: Self-pay | Admitting: Oncology

## 2014-01-25 ENCOUNTER — Ambulatory Visit (INDEPENDENT_AMBULATORY_CARE_PROVIDER_SITE_OTHER): Payer: Medicare Other | Admitting: General Surgery

## 2014-01-25 ENCOUNTER — Encounter: Payer: Self-pay | Admitting: General Surgery

## 2014-01-25 VITALS — BP 142/72 | HR 88 | Resp 16 | Ht 66.0 in | Wt 166.0 lb

## 2014-01-25 DIAGNOSIS — R1900 Intra-abdominal and pelvic swelling, mass and lump, unspecified site: Secondary | ICD-10-CM

## 2014-01-25 DIAGNOSIS — R222 Localized swelling, mass and lump, trunk: Secondary | ICD-10-CM

## 2014-01-25 NOTE — Progress Notes (Signed)
Patient ID: Marcia Collins, female   DOB: 03/17/1946, 68 y.o.   MRN: [id number]  Chief Complaint  Patient presents with  . Other    New Pt evaluation of abdominal mass    HPI Marcia Collins is a 68 y.o. female who presents for an evaluation of an abdominal mass. The area was noticed 1 year ago. Chemotherapy was done by Dr. Choksi finished 2 months ago. No family history of colon cancer. PET Scan 12/15/13. MRI 06/2013.   HPI  Past Medical History  Diagnosis Date  . Cancer     gallbladder    Past Surgical History  Procedure Laterality Date  . Cholecystectomy  03/2006  . Hand surgery Right 2012  . Portacath placement  01/2013  . Colonoscopy  2013    Family History  Problem Relation Age of Onset  . Other Father     brain tumor    Social History History  Substance Use Topics  . Smoking status: Never Smoker   . Smokeless tobacco: Never Used  . Alcohol Use: No    No Known Allergies  Current Outpatient Prescriptions  Medication Sig Dispense Refill  . lisinopril (PRINIVIL,ZESTRIL) 20 MG tablet Take 1 tablet by mouth daily.      . traMADol (ULTRAM) 50 MG tablet Take 1 tablet by mouth daily.       No current facility-administered medications for this visit.    Review of Systems Review of Systems  Constitutional: Negative.   Respiratory: Negative.   Cardiovascular: Negative.     Blood pressure 142/72, pulse 88, resp. rate 16, height 5' 6" (1.676 m), weight 166 lb (75.297 kg).  Physical Exam Physical Exam  Constitutional: She is oriented to person, place, and time. She appears well-developed and well-nourished.  Eyes: Conjunctivae are normal. No scleral icterus.  Neck: Neck supple. No thyromegaly present.  Cardiovascular: Normal rate, regular rhythm and normal heart sounds.   No murmur heard. Pulmonary/Chest: Effort normal and breath sounds normal.  Abdominal: Soft. Normal appearance and bowel sounds are normal. She exhibits mass (right abdominal mass present not well  defined, located lateral end of subcostal incision.). There is no hepatosplenomegaly. There is no tenderness. No hernia.  Lymphadenopathy:    She has no cervical adenopathy.    She has no axillary adenopathy.  Neurological: She is alert and oriented to person, place, and time.  Skin: Skin is warm and dry.    Data Reviewed PET scan. US performed here- shows a 4cm mass in muscle as seen on PET CT  Assessment    Focal metastatic disease in abd wall. Given the excellent response from initial surgery and more recent chemo feel excision of this none met is  reasonable. Discussed in full with pt and she is agreeable.    Plan    Surgery to be scheduled.     Patient is scheduled for surgery at [facility] on 02/01/14. She will pre admit by phone on 01/26/14. She is aware of the date and instructions.    Crawley, Stephanie F 01/25/2014, 11:27 AM    

## 2014-01-25 NOTE — Patient Instructions (Addendum)
Follow up appointment to be announced.  Patient is scheduled for surgery at [facility] on 02/01/14. She will pre admit by phone on 01/26/14. She is aware of the date and instructions.

## 2014-01-26 ENCOUNTER — Ambulatory Visit: Payer: Self-pay | Admitting: Anesthesiology

## 2014-01-26 ENCOUNTER — Other Ambulatory Visit: Payer: Medicare Other

## 2014-01-26 DIAGNOSIS — R222 Localized swelling, mass and lump, trunk: Secondary | ICD-10-CM

## 2014-01-26 NOTE — Addendum Note (Signed)
Addended by: Criss Alvine F on: 01/26/2014 10:30 AM   Modules accepted: Orders

## 2014-01-27 ENCOUNTER — Ambulatory Visit: Payer: Self-pay | Admitting: Oncology

## 2014-02-01 ENCOUNTER — Ambulatory Visit: Payer: Self-pay | Admitting: General Surgery

## 2014-02-01 DIAGNOSIS — C799 Secondary malignant neoplasm of unspecified site: Secondary | ICD-10-CM

## 2014-02-01 HISTORY — PX: MASS EXCISION: SHX2000

## 2014-02-02 ENCOUNTER — Encounter: Payer: Self-pay | Admitting: General Surgery

## 2014-02-04 ENCOUNTER — Encounter: Payer: Self-pay | Admitting: General Surgery

## 2014-02-04 LAB — PATHOLOGY REPORT

## 2014-02-10 ENCOUNTER — Ambulatory Visit (INDEPENDENT_AMBULATORY_CARE_PROVIDER_SITE_OTHER): Payer: Self-pay | Admitting: General Surgery

## 2014-02-10 ENCOUNTER — Encounter: Payer: Self-pay | Admitting: General Surgery

## 2014-02-10 VITALS — BP 124/68 | HR 72 | Resp 12 | Ht 66.0 in | Wt 167.0 lb

## 2014-02-10 DIAGNOSIS — C799 Secondary malignant neoplasm of unspecified site: Secondary | ICD-10-CM

## 2014-02-10 NOTE — Progress Notes (Signed)
This is a 68 year old female here today for her post op abdominal tumor removal done on 02/02/14.  Path report confirmed metastatic choleangiocarcinoma. Margins clear.   Pt with no complaints. Incision looks clean with mild swelling likely from seroma. Recheck in 2-3 weeks.

## 2014-02-10 NOTE — Patient Instructions (Signed)
Pt to return in 2-3 weeks.

## 2014-02-14 LAB — CANCER ANTIGEN 19-9: CA 19-9: 120 U/mL — ABNORMAL HIGH (ref 0–35)

## 2014-02-27 ENCOUNTER — Ambulatory Visit: Payer: Self-pay | Admitting: Oncology

## 2014-02-28 ENCOUNTER — Encounter: Payer: Self-pay | Admitting: General Surgery

## 2014-03-01 ENCOUNTER — Encounter: Payer: Self-pay | Admitting: General Surgery

## 2014-03-01 ENCOUNTER — Ambulatory Visit (INDEPENDENT_AMBULATORY_CARE_PROVIDER_SITE_OTHER): Payer: Self-pay | Admitting: General Surgery

## 2014-03-01 VITALS — BP 116/78 | HR 74 | Resp 12 | Ht 66.0 in | Wt 165.0 lb

## 2014-03-01 DIAGNOSIS — R1900 Intra-abdominal and pelvic swelling, mass and lump, unspecified site: Secondary | ICD-10-CM

## 2014-03-01 DIAGNOSIS — R222 Localized swelling, mass and lump, trunk: Secondary | ICD-10-CM

## 2014-03-01 NOTE — Patient Instructions (Signed)
The patient is aware to call back for any questions or concerns.  

## 2014-03-01 NOTE — Progress Notes (Signed)
Here today for postoperative visit, excision abdominal wall mass, 02-01-14. She states she is doing well. Denies any gastrointestinal issues. Her next appointment with Dr Oliva Bustard is for December.  Incision has well healed. There does not appear to be any fascial weakness.  Overall, patient is currently disease free. Further follow up with Dr Oliva Bustard and here as needed.

## 2014-03-29 ENCOUNTER — Ambulatory Visit: Payer: Self-pay | Admitting: Oncology

## 2014-04-20 LAB — CBC CANCER CENTER
BASOS ABS: 0 x10 3/mm (ref 0.0–0.1)
BASOS PCT: 1.1 %
EOS ABS: 0.1 x10 3/mm (ref 0.0–0.7)
EOS PCT: 2.8 %
HCT: 39.1 % (ref 35.0–47.0)
HGB: 12.9 g/dL (ref 12.0–16.0)
Lymphocyte #: 1 x10 3/mm (ref 1.0–3.6)
Lymphocyte %: 25.9 %
MCH: 29.2 pg (ref 26.0–34.0)
MCHC: 33 g/dL (ref 32.0–36.0)
MCV: 89 fL (ref 80–100)
MONOS PCT: 9.9 %
Monocyte #: 0.4 x10 3/mm (ref 0.2–0.9)
NEUTROS ABS: 2.3 x10 3/mm (ref 1.4–6.5)
Neutrophil %: 60.3 %
PLATELETS: 148 x10 3/mm — AB (ref 150–440)
RBC: 4.41 10*6/uL (ref 3.80–5.20)
RDW: 14.4 % (ref 11.5–14.5)
WBC: 3.9 x10 3/mm (ref 3.6–11.0)

## 2014-04-20 LAB — COMPREHENSIVE METABOLIC PANEL
ALT: 33 U/L
Albumin: 3.9 g/dL (ref 3.4–5.0)
Alkaline Phosphatase: 121 U/L — ABNORMAL HIGH
Anion Gap: 8 (ref 7–16)
BUN: 10 mg/dL (ref 7–18)
Bilirubin,Total: 0.6 mg/dL (ref 0.2–1.0)
Calcium, Total: 8.9 mg/dL (ref 8.5–10.1)
Chloride: 103 mmol/L (ref 98–107)
Co2: 30 mmol/L (ref 21–32)
Creatinine: 0.9 mg/dL (ref 0.60–1.30)
EGFR (Non-African Amer.): >60
GLUCOSE: 83 mg/dL (ref 65–99)
OSMOLALITY: 279 (ref 275–301)
Potassium: 3.8 mmol/L (ref 3.5–5.1)
SGOT(AST): 28 U/L (ref 15–37)
Sodium: 141 mmol/L (ref 136–145)
TOTAL PROTEIN: 8 g/dL (ref 6.4–8.2)

## 2014-04-21 LAB — CANCER ANTIGEN 19-9: CA 19-9: 113 U/mL — ABNORMAL HIGH (ref 0–35)

## 2014-04-29 ENCOUNTER — Ambulatory Visit: Payer: Self-pay | Admitting: Oncology

## 2014-06-06 ENCOUNTER — Ambulatory Visit: Payer: Self-pay | Admitting: Oncology

## 2014-06-28 ENCOUNTER — Ambulatory Visit
Admit: 2014-06-28 | Disposition: A | Discharge status: Home / Self Care | Payer: Self-pay | Attending: Oncology | Admitting: Oncology

## 2014-07-22 LAB — COMPREHENSIVE METABOLIC PANEL
ALK PHOS: 80 U/L
ANION GAP: 7 (ref 7–16)
Albumin: 4.5 g/dL
BUN: 10 mg/dL
Bilirubin,Total: 0.6 mg/dL
CO2: 27 mmol/L
CREATININE: 0.77 mg/dL
Calcium, Total: 9.2 mg/dL
Chloride: 102 mmol/L
EGFR (African American): >60
EGFR (Non-African Amer.): >60
GLUCOSE: 101 mg/dL — AB
Potassium: 3.9 mmol/L
SGOT(AST): 34 U/L
SGPT (ALT): 21 U/L
SODIUM: 136 mmol/L
TOTAL PROTEIN: 8.1 g/dL

## 2014-07-22 LAB — CBC CANCER CENTER
BASOS ABS: 0 x10 3/mm (ref 0.0–0.1)
Basophil %: 0.8 %
Eosinophil #: 0.1 x10 3/mm (ref 0.0–0.7)
Eosinophil %: 1.5 %
HCT: 40 % (ref 35.0–47.0)
HGB: 13.5 g/dL (ref 12.0–16.0)
LYMPHS ABS: 0.9 x10 3/mm — AB (ref 1.0–3.6)
Lymphocyte %: 21.2 %
MCH: 29.9 pg (ref 26.0–34.0)
MCHC: 33.7 g/dL (ref 32.0–36.0)
MCV: 89 fL (ref 80–100)
MONO ABS: 0.5 x10 3/mm (ref 0.2–0.9)
MONOS PCT: 11.5 %
NEUTROS ABS: 2.9 x10 3/mm (ref 1.4–6.5)
Neutrophil %: 65 %
Platelet: 167 x10 3/mm (ref 150–440)
RBC: 4.51 10*6/uL (ref 3.80–5.20)
RDW: 13.9 % (ref 11.5–14.5)
WBC: 4.5 x10 3/mm (ref 3.6–11.0)

## 2014-07-25 LAB — CANCER ANTIGEN 19-9: CA 19-9: 80 U/mL — ABNORMAL HIGH (ref 0–35)

## 2014-07-29 ENCOUNTER — Ambulatory Visit
Admit: 2014-07-29 | Disposition: A | Discharge status: Home / Self Care | Payer: Self-pay | Attending: Oncology | Admitting: Oncology

## 2014-08-06 ENCOUNTER — Inpatient Hospital Stay
Admit: 2014-08-06 | Disposition: A | Discharge status: Other Acute Inpatient Hospital | Payer: Self-pay | Attending: Internal Medicine | Admitting: Internal Medicine

## 2014-08-06 LAB — COMPREHENSIVE METABOLIC PANEL
ANION GAP: 7 (ref 7–16)
Albumin: 4.3 g/dL
Alkaline Phosphatase: 75 U/L
BILIRUBIN TOTAL: 0.6 mg/dL
BUN: 21 mg/dL — ABNORMAL HIGH
CALCIUM: 9.4 mg/dL
CHLORIDE: 99 mmol/L — AB
CO2: 29 mmol/L
CREATININE: 0.85 mg/dL
EGFR (Non-African Amer.): >60
GLUCOSE: 96 mg/dL
Potassium: 4 mmol/L
SGOT(AST): 29 U/L
SGPT (ALT): 25 U/L
SODIUM: 135 mmol/L
Total Protein: 7.7 g/dL

## 2014-08-06 LAB — URINALYSIS, COMPLETE
Bilirubin,UR: NEGATIVE
Blood: NEGATIVE
Glucose,UR: NEGATIVE mg/dL (ref 0–75)
Ketone: NEGATIVE
NITRITE: NEGATIVE
Ph: 6 (ref 4.5–8.0)
Protein: NEGATIVE
SPECIFIC GRAVITY: 1.01 (ref 1.003–1.030)

## 2014-08-06 LAB — CBC
HCT: 39.5 % (ref 35.0–47.0)
HGB: 13.1 g/dL (ref 12.0–16.0)
MCH: 29.9 pg (ref 26.0–34.0)
MCHC: 33.1 g/dL (ref 32.0–36.0)
MCV: 90 fL (ref 80–100)
PLATELETS: 146 10*3/uL — AB (ref 150–440)
RBC: 4.38 10*6/uL (ref 3.80–5.20)
RDW: 14.5 % (ref 11.5–14.5)
WBC: 5.3 10*3/uL (ref 3.6–11.0)

## 2014-08-06 LAB — TROPONIN I

## 2014-08-07 LAB — CBC WITH DIFFERENTIAL/PLATELET
Basophil #: 0 10*3/uL (ref 0.0–0.1)
Basophil %: 0.5 %
EOS PCT: 0 %
Eosinophil #: 0 10*3/uL (ref 0.0–0.7)
HCT: 38.7 % (ref 35.0–47.0)
HGB: 13 g/dL (ref 12.0–16.0)
LYMPHS ABS: 0.5 10*3/uL — AB (ref 1.0–3.6)
Lymphocyte %: 13.7 %
MCH: 30.3 pg (ref 26.0–34.0)
MCHC: 33.5 g/dL (ref 32.0–36.0)
MCV: 91 fL (ref 80–100)
MONOS PCT: 2.3 %
Monocyte #: 0.1 x10 3/mm — ABNORMAL LOW (ref 0.2–0.9)
NEUTROS ABS: 2.7 10*3/uL (ref 1.4–6.5)
Neutrophil %: 83.5 %
Platelet: 140 10*3/uL — ABNORMAL LOW (ref 150–440)
RBC: 4.28 10*6/uL (ref 3.80–5.20)
RDW: 14.3 % (ref 11.5–14.5)
WBC: 3.3 10*3/uL — ABNORMAL LOW (ref 3.6–11.0)

## 2014-08-07 LAB — BASIC METABOLIC PANEL
Anion Gap: 5 — ABNORMAL LOW (ref 7–16)
BUN: 21 mg/dL — ABNORMAL HIGH
CALCIUM: 9.2 mg/dL
CHLORIDE: 106 mmol/L
CREATININE: 0.71 mg/dL
Co2: 26 mmol/L
EGFR (African American): >60
EGFR (Non-African Amer.): >60
Glucose: 161 mg/dL — ABNORMAL HIGH
POTASSIUM: 4.5 mmol/L
Sodium: 137 mmol/L

## 2014-08-08 LAB — BASIC METABOLIC PANEL
Anion Gap: 8 (ref 7–16)
BUN: 25 mg/dL — ABNORMAL HIGH
CALCIUM: 9.4 mg/dL
Chloride: 106 mmol/L
Co2: 26 mmol/L
Creatinine: 0.72 mg/dL
EGFR (African American): >60
EGFR (Non-African Amer.): >60
Glucose: 123 mg/dL — ABNORMAL HIGH
Potassium: 3.9 mmol/L
Sodium: 140 mmol/L

## 2014-08-08 LAB — CBC WITH DIFFERENTIAL/PLATELET
BASOS ABS: 0 10*3/uL (ref 0.0–0.1)
BASOS PCT: 0 %
EOS ABS: 0 10*3/uL (ref 0.0–0.7)
Eosinophil %: 0 %
HCT: 37.4 % (ref 35.0–47.0)
HGB: 12.3 g/dL (ref 12.0–16.0)
Lymphocyte #: 0.5 10*3/uL — ABNORMAL LOW (ref 1.0–3.6)
Lymphocyte %: 4.9 %
MCH: 29.7 pg (ref 26.0–34.0)
MCHC: 32.9 g/dL (ref 32.0–36.0)
MCV: 90 fL (ref 80–100)
MONOS PCT: 3.5 %
Monocyte #: 0.4 x10 3/mm (ref 0.2–0.9)
NEUTROS ABS: 10.1 10*3/uL — AB (ref 1.4–6.5)
Neutrophil %: 91.6 %
Platelet: 146 10*3/uL — ABNORMAL LOW (ref 150–440)
RBC: 4.14 10*6/uL (ref 3.80–5.20)
RDW: 13.9 % (ref 11.5–14.5)
WBC: 11 10*3/uL (ref 3.6–11.0)

## 2014-08-09 DIAGNOSIS — D496 Neoplasm of unspecified behavior of brain: Secondary | ICD-10-CM | POA: Insufficient documentation

## 2014-08-09 DIAGNOSIS — F4489 Other dissociative and conversion disorders: Secondary | ICD-10-CM | POA: Insufficient documentation

## 2014-08-09 DIAGNOSIS — R4701 Aphasia: Secondary | ICD-10-CM | POA: Insufficient documentation

## 2014-08-09 DIAGNOSIS — I10 Essential (primary) hypertension: Secondary | ICD-10-CM | POA: Insufficient documentation

## 2014-08-09 DIAGNOSIS — C23 Malignant neoplasm of gallbladder: Secondary | ICD-10-CM | POA: Insufficient documentation

## 2014-08-09 DIAGNOSIS — R270 Ataxia, unspecified: Secondary | ICD-10-CM | POA: Insufficient documentation

## 2014-08-09 LAB — URINE CULTURE

## 2014-08-11 LAB — CULTURE, BLOOD (SINGLE)

## 2014-08-19 NOTE — Op Note (Signed)
PATIENT NAME:  RHYTHM, GUBBELS MR#:  [id number] DATE OF BIRTH:  19-Jul-1945  DATE OF PROCEDURE:  01/29/2013  PREOPERATIVE DIAGNOSES: Cholangiocarcinoma.   POSTOPERATIVE DIAGNOSIS: Cholangiocarcinoma.    PROCEDURE PERFORMED: Insertion of right internal jugular Port-A-Cath with ultrasound and fluoroscopic guidance.   SURGEON: Hortencia Pilar, M.D.   SEDATION: Versed 3 mg plus fentanyl 100 mcg administered IV. Continuous ECG, pulse oximetry and cardiopulmonary monitoring is performed throughout the entire procedure by the interventional radiology nurse. Total sedation time was approximately 40 minutes.   ACCESS:  Right IJ.   CONTRAST USED:  None.   FLUOROSCOPY TIME: Approximately 1 minute.   INDICATIONS: Ms. Penza is a 69 year old woman who was found to have cholangiocarcinoma. and will be requiring chemotherapy and therefore requires appropriate IV access. Risks and benefits for Infuse-a-Port placement were reviewed. All questions answered. The patient agrees to proceed.   DESCRIPTION OF PROCEDURE: The patient is taken to special procedures and placed in the supine position. After adequate sedation is achieved, her right neck and chest wall are prepped and draped in sterile fashion. 1% lidocaine with epinephrine is infiltrated in the soft tissues.   Ultrasound is placed in a sterile sleeve. Jugular vein is identified. It is echolucent and compressible indicating patency. Image is recorded for the permanent record. Under real-time visualization, Seldinger needle is inserted into the jugular vein. J-wire is then advanced under fluoroscopic guidance into the inferior vena cava.   An 11 blade scalpel was used to make a counterincision at the wire insertion site and a small pocket created with a hemostat using blunt dissection. A linear incision is created two fingerbreadths below the clavicle and a small pocket is fashioned with both blunt and sharp dissection. It is tested for appropriate size and  subsequently the catheter is pulled subcutaneously from the pocket to the neck counterincision.   Peel-away sheath is inserted over the wire. The wire and dilator removed and the catheter is inserted. The peel-away sheath is removed. The catheter is adjusted for proper tip position under fluoroscopy and evaluated for smooth contour. The catheter is then transected. The hub is connected and slipped into the pocket. The hub is then accessed percutaneously with a Huber needle. It aspirates easily and flushes well, and subsequently the pocket was closed using interrupted 3-0 Vicryl, followed by 4-0 Monocryl subcuticular and then Dermabond. Neck counterincision was closed with 4-0 Monocryl subcuticular and then Dermabond. The patient tolerated the procedure well. There were no immediate complications. Sponge and needle counts are correct. She is taken to the recovery area in excellent condition.    ____________________________ Katha Cabal, MD ggs:dp D: 01/29/2013 12:40:06 ET T: 01/29/2013 13:19:06 ET JOB#: [id number]  cc: Katha Cabal, MD, <Dictator> Juline Patch, MD Martie Lee. Oliva Bustard, MD Katha Cabal MD ELECTRONICALLY SIGNED 02/09/2013 17:53

## 2014-08-20 NOTE — Op Note (Signed)
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    OPERATION PERFORMED: Resection of malignant tumor/abdominal wall muscle.   SURGEON: Mckinley Jewel, MD  ANESTHESIA: General.   COMPLICATIONS: None.   ESTIMATED BLOOD LOSS: Minimal.   DRAINS: None.   DESCRIPTION OF PROCEDURE: The patient was put to sleep in the supine position on the operating table. The abdomen was prepped and draped out as a sterile field. An ultrasound probe was brought into the field, and the mass in question was identified overlying the lateral end of the right subcostal incision. This was located in the bulk of the muscle and appeared to be in the intermediate layer of the muscle, likely the internal oblique. The extent of this was marked out on the skin.   A timeout was performed, then the previous skin incision along the lateral end of the subcostal incision was opened and carefully deepened through to expose the external oblique muscle. Bleeding was controlled with cautery. The external oblique muscle was opened in this same line, and underneath this was a hard 6 x 4 cm mass occupying the middle portion of the musculature. Circumferentially, this was excised out using cautery for control of bleeding and allowing for some margins all around it. It did not seem to involve the posterior aspect of the abdominal wall musculature or the peritoneum, except in one spot where it came closed to it. This area was repaired with a 0 Vicryl stitch.   The wound was irrigated. The external oblique was closed over also with interrupted 0 Vicryl stitches. The wound was irrigated again. Subcutaneous tissue was closed with 2-0 Vicryl and the skin closed with subcuticular 4-0 Vicryl, covered with Dermabond. The  excised tissue was sent in formalin for pathology.    ____________________________ S.Robinette Haines, MD sgs:MT D: 02/02/2014 08:48:00 ET T: 02/02/2014 09:26:50 ET JOB#: [id number]  cc: S.G. Jamal Collin, MD, <Dictator> [facility] Robinette Haines MD ELECTRONICALLY SIGNED 02/02/2014 13:43

## 2014-08-26 ENCOUNTER — Other Ambulatory Visit: Payer: Self-pay | Admitting: *Deleted

## 2014-08-26 DIAGNOSIS — C7949 Secondary malignant neoplasm of other parts of nervous system: Principal | ICD-10-CM

## 2014-08-26 DIAGNOSIS — C7931 Secondary malignant neoplasm of brain: Secondary | ICD-10-CM

## 2014-08-28 NOTE — Discharge Summary (Signed)
PATIENT NAME:  Marcia Collins, Marcia Collins MR#:  [id number] DATE OF BIRTH:  1945-06-04  DATE OF ADMISSION:  08/06/2014 DATE OF DISCHARGE:  08/08/2014  DISCHARGE DIAGNOSES:  1.  Intracranial mass, possible extension of metastatic disease with 6.3 x 4 x 4.2 cm complex mass in the left lateral ventricle.  2.  Urinary tract infection.  3.  History of cholangiocarcinoma.  4.  Acute metabolic encephalopathy.  5.  Mild dehydration.  6.  Thrombocytopenia and leukopenia.   SECONDARY DIAGNOSIS:  History of migraine.   CONSULTATIONS: Oncology, Dr. Forest Gleason.   PROCEDURES AND RADIOLOGY: CT scan of the head without contrast on April 9 showed a 5.8 x 3.8 cm complex mass just above the left lateral ventricle and crossing midline. Mild left to right midline shift noted.   MRI of the brain on April 10 showed 6.3 x 4 x 5.2 cm complex mass centered at the level of the posterior margin of the left lateral ventricle. Surrounding vasogenic edema and mass effect. Possible intracranial metastatic lesion. Primary brain lesion like glioblastoma can also be considered.   MAJOR LABORATORY PANEL:  1.  UA on admission showed too numerous WBCs, 3 + leukocyte esterase, many bacteria.  2.  Blood cultures x were negative on April 9.  3.  Urine culture grew more than 100,000 colonies of gram-negative rod.   HISTORY AND SHORT HOSPITAL COURSE: The patient is a 69 year old female with known history of cholangiocarcinoma, was admitted for altered mental status. Please see Dr. Penelope Galas dictated history and physical for further details. Oncology consultation was obtained with Dr. Leia Alf and subsequently Dr. Forest Gleason, who recommended MRI of the brain for further evaluation, which was performed with results dictated above. After discussion with the patient's primary oncology Dr. Oliva Bustard decision was made to transfer the patient to [facility] for further evaluation of the brain mass by neurosurgery where she is being  transferred.   On the date of discharge she was feeling much better. Her vital signs were as follows: Temperature 97.9, heart rate 74 per minute, respirations 20 per minute, blood pressure 116/67. She is saturating 98% on room air.   PERTINENT PHYSICAL EXAMINATION ON THE DATE OF DISCHARGE:  CARDIOVASCULAR: S1, S2 normal. No murmurs, rubs, or gallop.  LUNGS: Clear to auscultation bilaterally. No wheezing, rales, rhonchi, or crepitation.  ABDOMEN: Soft, benign.  NEUROLOGIC: Nonfocal examination.   All other physical examination remained at baseline.   INPATIENT MEDICATIONS WHILE THE PATIENT WAS IN THE HOSPITAL:  1. Alprazolam 0.5 mg p.o. every 6 hours as needed.  2. Rocephin IV 1 gram daily.  3. Dexamethasone 4 mg IV every 6 hours.  4. Lisinopril 20 mg p.o. daily.  5. Tramadol 50 mg p.o. every 6 hours as needed.  6. Tramadol 50 mg p.o. at bedtime.   TOTAL TIME TAKING CARE OF THIS PATIENT: 45 minutes.    ____________________________ Lucina Mellow. Manuella Ghazi, MD vss:bu D: 08/08/2014 17:39:32 ET T: 08/08/2014 17:48:48 ET JOB#: [id number]  cc: Tangala Wiegert S. Manuella Ghazi, MD, <Dictator> Shirlean Mylar, MD Martie Lee. Oliva Bustard, MD Juline Patch, MD Lucina Mellow [facility] MD ELECTRONICALLY SIGNED 08/11/2014 10:32

## 2014-08-28 NOTE — Consult Note (Signed)
Reason for Visit: This 69 year old Female patient presents to the clinic for initial evaluation of  Brain metastases .   Referred by Dr. Oliva Bustard.  Diagnosis:  Chief Complaint/Diagnosis   69 year old female with brain metastasis biopsy-positive from probable gallbladder primary  HPI   Patient is a 69 year old female interesting case of a patient who had resection in 2008 for T1 N0 adenocarcinoma of the gallbladder performed at [facility]. She had no follow-up chemotherapy or radiation therapy. In September 2014 presented with MRI scan showing lesions consistent with multiple metastasis and a rib lesion which was biopsy positive for cholangiocarcinoma. She's been treated in the past since October 2014 luck with Taxol platinum and gemcitabine. She was recently admitted to [facility] with mental status changes confusion and CT scan and MRI scan showed a 6 cm complex mass in the left lateral ventricle concerning for metastasis. She was biopsied at [facility] and biopsy is positive for poorly differentiated neoplasm with immunohistochemical phenotype nonspecific but would be compatible with gallbladder primary. She is seen today and recommendation at [facility] was for whole brain radiation. She is seen today for consultation. She is doing fairly well she states her confusion and memory is improving every day she is accompanied by her brother today. She has no focal neurologic complaints.  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., 07-Aug-2014   gallbladder cancer:    Migraine headaches:    Gall Bladder Surgery: Gall bladder was cancerous, 15-Apr-2006   Liver and small bowel reconstruction: Due to involvement with cancer in gall bladder  Past, Family and Social History:  Past Medical History positive   Cardiovascular hypertension   Neurological/Psychiatric migraine   Past Surgical History Liver and bowel reconstruction, gallbladder surgery for gallbladder cancer   Family  History noncontributory   Social History noncontributory   Additional Past Medical and Surgical History Accompanied by her brother today   Allergies:   No Known Allergies:   Home Meds:  Home Medications: Medication Instructions Status  ALPRAZolam 0.25 mg oral tablet 1 tab(s) orally every 6 hours, As Needed - for Anxiety, Nervousness Active  traMADol 50 mg oral tablet 1 tab(s) orally every 4 hours, As Needed - for Pain Active  lisinopril 20 mg oral tablet 1 tab(s) orally once a day Active  dexamethasone 4 mg oral tablet 1 tab(s) orally 2 times a day  Patient on tapering dose at this time.  Active   Review of Systems:  General negative   Performance Status (ECOG) 0   Skin negative   Breast negative   Ophthalmologic negative   ENMT negative   Respiratory and Thorax negative   Cardiovascular negative   Gastrointestinal negative   Genitourinary negative   Musculoskeletal negative   Neurological negative   Psychiatric negative   Hematology/Lymphatics negative   Endocrine negative   Allergic/Immunologic negative   Review of Systems   Except for confusion and memory lossdenies any weight loss, fatigue, weakness, fever, chills or night sweats. Patient denies any loss of vision, blurred vision. Patient denies any ringing  of the ears or hearing loss. No irregular heartbeat. Patient denies heart murmur or history of fainting. Patient denies any chest pain or pain radiating to her upper extremities. Patient denies any shortness of breath, difficulty breathing at night, cough or hemoptysis. Patient denies any swelling in the lower legs. Patient denies any nausea vomiting, vomiting of blood, or coffee ground material in the vomitus. Patient denies any stomach pain. Patient states  has had normal bowel movements no significant constipation or diarrhea. Patient denies any dysuria, hematuria or significant nocturia. Patient denies any problems walking, swelling in the joints or loss  of balance. Patient denies any skin changes, loss of hair or loss of weight. Patient denies any excessive worrying or anxiety or significant depression. Patient denies any problems with insomnia. Patient denies excessive thirst, polyuria, polydipsia. Patient denies any swollen glands, patient denies easy bruising or easy bleeding. Patient denies any recent infections, allergies or URI. Patient "s visual fields have not changed significantly in recent time.   Nursing Notes:  Nursing Vital Signs and Chemo Nursing Nursing Notes: *CC Vital Signs Flowsheet:   22-Apr-16 10:27  Temp Temperature 98  Pulse Pulse 74  Respirations Respirations 16  SBP SBP 136  DBP DBP 84  Current Weight (kg) (kg) 72.8   Physical Exam:  General/Skin/HEENT:  General normal   Skin normal   Eyes normal   ENMT normal   Head and Neck normal   Additional PE A well-developed female in NAD. She's had a recent biopsy of hers brain with a small bandage placed most likely stereotactic biopsy. Cranial nerves II through XII grossly intact. She does have cataracts bilaterally making funduscopic examination impossible. Could visual fields within normal range. Motor sensory and DTR levels are equal and symmetric in the upper lower extremities. Lungs are clear to A&P cardiac examination shows regular rate and rhythm. Abdomen is benign.   Breasts/Resp/CV/GI/GU:  Respiratory and Thorax normal   Cardiovascular normal   Gastrointestinal normal   Genitourinary normal   MS/Neuro/Psych/Lymph:  Musculoskeletal normal   Neurological normal   Lymphatics normal   Other Results:  Radiology Results: LabUnknown:    10-Apr-16 11:49, MRI Brain  With/Without Contrast  PACS Image   MRI:  MRI Brain  With/Without Contrast   REASON FOR EXAM:    mass seen on CT  COMMENTS:       PROCEDURE: MR  - MR BRAIN WO/W CONTRAST  - Aug 07 2014 11:49AM     CLINICAL DATA:  69 year old female with history of either  gallbladder cancer or  cholangiocarcinoma presenting with abnormal  headCT and altered mental status for the past 2 weeks. Subsequent  encounter.    EXAM:  MRI HEAD WITHOUT AND WITH CONTRAST    TECHNIQUE:  Multiplanar, multiecho pulse sequences of the brain and surrounding  structures were obtained without and with intravenous contrast.    CONTRAST:  14 cc MultiHance.    COMPARISON:  08/06/2014 head CT.  No comparison brain MR.    FINDINGS:  6.3 x 4 x 5.2 cm complex mass centered at the level of the posterior  margin of the left lateral ventricle. Peripheral irregular  enhancement with largest solid peripheral enhancing component  measuring up to 2.2 cm (laterally). Central complex cystic/ necrotic  component.    It is difficult determined if this arises within the ventricle or  extends into the ventricle. On sagittal images, enhancement is  contiguous with the choroid raising the possibly that this is an  intraventricular mass deforming adjacent structures and partially  trapping the left temporal horn.    This mass compresses the septum pellucidum with 1.1 cm displacement  to the right. Compression of the left thalamus, left midbrain, left  quadrigeminal plate and third ventricle. Inferior displacement of  the in internal cerebral veins and posterior displacement of the  vein empty when which remain patent.    Narrowing of the fourth ventricle which remains patent.  Surrounding vasogenic edema extends into the left parietal lobe,  portion of the corpus callosum and posterior margin of the left  basal ganglia and.  With the patient's history of metastatic gallbladder cancer or  cholangiocarcinoma, this may represent an intracranial metastatic  lesion. A primary brain lesion is also consideration such as a  glioblastoma. The patient is not of the right age for choroid plexus  carcinoma or central neurocytoma. Characteristics are not typical  for an interventricular meningioma.    On gradient  sequence there is either small amount of blood  associated with this mass or small calcifications not detected by  CT.    No central restricted motion to suggest this represents an abscess.    No acute infarct.  Cervical medullary junction, pituitary region and orbital structures  unremarkable.    Major intracranial vascular structures are patent.     IMPRESSION:  6.3 x 4 x 5.2 cm complex mass centered at the level of the posterior  margin of the left lateral ventricle. It is difficult determined if  this arises within the ventricle or extends into the ventricle. On  sagittal images, enhancement is contiguous with the choroid raising  the possibly that this is an intraventricular mass deforming  adjacent structures and partially trapping the left temporal horn.  Surrounding vasogenic edema and mass effect as detailed above.    With the patient's history of metastatic gallbladder cancer or  cholangiocarcinoma, this may represent an intracranial metastatic  lesion. A primary brain lesion is also consideration such as a  glioblastoma.    Please see above.      Electronically Signed    By: Genia Del M.D.    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 radiation oncology lesion was thought to be too large for stereotactic radiosurgery. Lesion does measure at least 6 cm and is intraventricular. I would plan on delivering whole brain radiation therapy to 3000 cGy. Risks and benefits of treating including hair loss scalp reaction cognitive decline all were discussed in detail to the patient and her brother. I have set her up for CT simulation and ordered that for early next week. Will discuss the case  personally with medical oncology.  I would like to take this opportunity for allowing me to participate in the care of your patient..  Fax to Physician:  Physicians To Recieve Fax: Juline Patch, MD - [phone] Christene Lye, MD - [phone].  Electronic Signatures: Eeva Schlosser, Roda Shutters (MD)  (Signed 22-Apr-16 10:57)  Authored: HPI, Diagnosis, Past Hx, PFSH, Allergies, Home Meds, ROS, Nursing Notes, Physical Exam, Other Results, Relevent Results, Encounter Assessment and Plan, Fax to Physician   Last Updated: 22-Apr-16 10:57 by Armstead Peaks (MD)

## 2014-08-28 NOTE — Consult Note (Signed)
PATIENT NAME:  Marcia Collins, Marcia Collins MR#:  [id number] DATE OF BIRTH:  08/21/1945  DATE OF CONSULTATION:  08/07/2014  CONSULTING PHYSICIAN:  Lindzey Zent R. 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   HISTORY OF PRESENT ILLNESS: Marcia Collins is a 69 year old woman with a past medical history significant for migraine headaches, cholecystectomy in 2007, at which time reportedly they found cholangiocarcinoma, status post liver and small bowel resection due to metastatic cholangiocarcinoma, who follows with oncologist, Dr. Oliva Bustard and is status post chemotherapy last year (last dose of gemcitabine and oxaliplatin was on 11/22/2013) and has been on monitoring since then. More recently, serum CA-19-9 level on 07/20/2014 was 80 as compared to 113 in December 2015 and 222 in August 2015).   The patient was brought to the emergency room yesterday since the family complained of her having recurrent altered mental status and confusion along with changes in her personality. CT scan, noncontrast, of the head shows 5.8 x 3.8 cm complex mass just above the left lateral ventricle crossing the midline. The patient is getting an MRI of the brain done today. She has been placed on Decadron. Clinically, she states that she is feeling better overall. She denies any headaches, nausea or vomiting. She denies any double vision or focal motor weakness. No speech disturbance. Appetite is fair. Denies any weight loss. Denies any new abdominal pain, diarrhea or blood in stools.   PAST MEDICAL AND SURGICAL HISTORY: As in HPI above.   FAMILY HISTORY: Noncontributory.   SOCIAL HISTORY: Nonsmoker. Denies alcohol usage. Lives with her husband.   ALLERGIES: No known drug allergies.   HOME MEDICATIONS: Xanax 0.25 mg every 6 hours p.r.n., lisinopril 20 mg once daily, tramadol 150 mg every 4 hours p.r.n. for pain.   REVIEW OF SYSTEMS: CONSTITUTIONAL: Has some recent generalized  fatigue and weakness. No fever or chills. No night sweats.  HEENT: Denies any current headaches or dizziness at rest. No epistaxis, ear or jaw pain. No sinus symptoms.  CARDIAC: No new angina, palpitation, orthopnea or PND.  LUNGS: No new dyspnea, cough, chest pain or hemoptysis.  GASTROINTESTINAL: As in HPI.  GENITOURINARY: No dysuria or hematuria.  SKIN: No new rashes or pruritus.  HEMATOLOGIC: Denies bleeding symptoms.  EXTREMITIES: No new swelling or pain.  MUSCULOSKELETAL: No new bone pains.  NEUROLOGIC: As in HPI. Also has had recent memory changes.  ENDOCRINE: No polyuria or polydipsia.   PHYSICAL EXAMINATION: GENERAL: The patient is a moderately built, thin individual, resting in bed. Alert and oriented to self, place and person and converses appropriately. No acute distress. No icterus.  VITAL SIGNS: 97.7, 60, 18, 96/65, 95% on room air.  HEENT: Normocephalic, atraumatic. Extraocular movements intact. Sclerae anicteric.  NECK: Negative for lymphadenopathy.  CARDIOVASCULAR: S1 and S2, regular rate and rhythm.  LUNGS: Show bilateral good air entry, no rhonchi.  ABDOMEN: Soft. No hepatomegaly or masses palpable clinically. Bowel sounds present.  EXTREMITIES: No major edema or cyanosis.  SKIN: No generalized rashes or major bruising.  NEUROLOGIC: Cranial nerves are intact, moves all extremities spontaneously. Gait not checked.  MUSCULOSKELETAL: No obvious joint redness or swelling.   LABORATORY RESULTS: WBC 3300, ANC 2700, hemoglobin 13.0, platelets 140,000. Creatinine 0.71. Potassium 4.5, calcium 9.2. Blood culture negative so far. UA abnormal with 3+ leukocyte esterase and numerous WBCs.   CT head without contrast reported a 5.8 x 3.8 cm complex mass seen just above the left lateral ventricle and crossing  midline. It does not result in significant ventricular dilation, although mild left to right midline shift is noted. MRI is recommended for further evaluation.   IMPRESSION AND  RECOMMENDATIONS: A 69 year old female with history of medical problems as described above including stage IV metastatic cholangiocarcinoma status post cholecystectomy initially and then had reportedly liver resection done for metastatic disease and more recently followed by Dr. Oliva Bustard. She has completed palliative chemotherapy with oxaliplatin and gemcitabine on 11/22/2013. More recently, CA-19-9 on 07/20/2014 was still abnormal at 80, but better than before. The patient admitted with progressive neurological symptoms as described in the history of present illness. Noncontrast CT scan of the head is showing large 5.8 x 3.8 cm mass. Clinically, she is feeling better after starting on IV steroids. Continue on this and supportive treatment. She also has evidence of urinary tract infection and agree with ongoing antibiotic with ceftriaxone. The patient is getting MRI of the brain today for further evaluation of brain metastasis. Unclear if this is solitary versus multiple. We will await MRI report. The patient and husband present have been explained that she likely has brain metastasis and overall prognosis is poor in this situation. We will request radiation oncology consultation for tomorrow by Dr. Baruch Gouty. We will request her primary oncologist, Dr. Oliva Bustard, to follow 04/11 onwards. The patient otherwise does not have any major pain issues at this time.   Thank you for the referral. Please feel free to contact me if any additional questions.     ____________________________ Rhett Bannister Ma Hillock, MD srp:TT D: 08/07/2014 12:19:47 ET T: 08/07/2014 12:37:18 ET JOB#: [id number]  cc: Sheryll Dymek R. Ma Hillock, MD, <Dictator> Alveta Heimlich MD ELECTRONICALLY SIGNED 08/08/2014 10:27

## 2014-08-28 NOTE — H&P (Signed)
PATIENT NAME:  Marcia Collins, Marcia Collins MR#:  [id number] DATE OF BIRTH:  1945/12/10  DATE OF ADMISSION:  08/06/2014  REFERRING EMERGENCY ROOM PHYSICIAN: Marylene Land, NP  PRIMARY CARE PHYSICIAN: Juline Patch, MD  PRIMARY ONCOLOGIST: Martie Lee. Choksi, MD   CHIEF COMPLAINT: Altered mental status.   HISTORY OF PRESENT ILLNESS: This is a very pleasant 69 year old woman with a past medical history of cholangiocarcinoma who presents with complaint of 2 weeks of altered mental status. The history is provided by her son, as the patient is a fairly poor historian. Her son reports that she has had a loss of memory including inability to perform routine daily tasks such as turning on the television. She tends to perform these tasks and cannot remember how to do it and then becomes very frustrated and angry. She has also had decreased motor function. She has been ataxic and clumsy, walking "as if she is drunk." These symptoms have been worsening over the past 2 weeks. On presentation to the Emergency Room, her vitals are stable and a noncontrast CT scan of the head shows a 5.8 x 3.8 cm complex mass just above the left lateral ventricle crossing the midline. She is being admitted for steroids, oncology consultation, and physical therapy.   PAST MEDICAL HISTORY:  1.  Cholangiocarcinoma followed by Dr. Oliva Bustard, in remission for 6 months prior to this admission.  2.  Migraine headaches.   PAST SURGICAL HISTORY:  1.  Cholecystectomy 2007.  2.  Liver and small-bowel resection due to metastatic cholangiocarcinoma.   SOCIAL HISTORY: The patient lives with her husband. She does not use a cane, walker, or any assistive devices. She has never been a cigarette smoker. Does not drink alcohol or use illicit substances.   FAMILY MEDICAL HISTORY: Negative for colon, breast, or ovarian cancer.   REVIEW OF SYSTEMS:  GENERAL: No fatigue, change in weight, fevers or chills.  HEENT: No change in vision or hearing. No pain in the  eyes or ears. No sore throat or difficulty swallowing. No sinus congestion.  RESPIRATORY: No shortness of breath, coughing, wheezing, or pleuritic-type pain.  CARDIOVASCULAR: No chest pain, palpitations, syncope, edema, or orthopnea.  GASTROINTESTINAL: No nausea, vomiting, diarrhea, or abdominal pain.  GENITOURINARY: No dysuria or frequency.  SKIN: No recent rashes, lesions, or changes.  ENDOCRINE: No hot or cold intolerance, polyuria or polydipsia.  MUSCULOSKELETAL: No new pain in the neck, back, shoulders, knees, or hips.  NEUROLOGIC: Positive as above for confusion, loss of memory, and loss of balance. No focal numbness or weakness. No headache or seizure. No recent migraines, though she does have a history of migraine headache.  PSYCHIATRIC: No bipolar disorder or schizophrenia.   HOME MEDICATIONS:  1.  Tramadol 50 mg 1 tablet every 4 hours as needed for pain.  2.  Lisinopril 20 mg 1 tablet daily.  3.  Alprazolam 0.25 mg 1 tablet every 6 hours as needed for anxiety.   ALLERGIES: No known allergies.   PHYSICAL EXAMINATION:  VITAL SIGNS: Temperature 97.8, pulse 66, respirations 17, blood pressure 108/64, oxygenation 100% on room air.  GENERAL: No acute distress.  HEENT: Pupils are both slightly enlarged. They are equal, reactive to light. Extraocular motion is intact. Conjunctivae are clear. Oral mucous membranes are pink and moist. She has good dentition. Posterior oropharynx is clear. No exudate, edema, or erythema. Trachea is midline. No cervical lymphadenopathy.  RESPIRATORY: Lungs are clear to auscultation bilaterally with good air movement.  CARDIOVASCULAR: Regular rate and rhythm. No  murmurs, rubs, or gallops. No peripheral edema. Peripheral pulses 2+.  ABDOMEN: Soft, nontender, nondistended. Bowel sounds are normal. No guarding, no rebound, no hepatosplenomegaly. No mass noted.  MUSCULOSKELETAL: No swollen tender joints. Range of motion is normal. Strength is 5/5 throughout.   SKIN: No rashes, lesions, or open wounds.  NEUROLOGIC: Cranial nerves II through XII are grossly intact with the exception of possible leftward deviation of the tongue; otherwise, strength and sensation are intact and normal bilaterally.  PSYCHIATRIC: She is alert and oriented with good insight into her clinical condition.   LABORATORY DATA: Sodium 135, potassium 4.0, chloride 99, bicarbonate 29, BUN 21, creatinine 0.85, glucose 95, calcium 9.8. LFTs are normal. Troponin is less than 0.03. White blood cells 5.3, hemoglobin 13.1, platelets 146,000, MCV is 90. Urinalysis shows too-many-to-count white blood cells with 3+ leukocyte esterase.   ASSESSMENT AND PLAN:  1.  New intracranial mass on CT scan: The Emergency Room communicated with Dr. Oliva Bustard, and orders were given for 10 mg of dexamethasone once and then to follow with 4 mg of dexamethasone every 6 hours. MRI is ordered and should be performed tomorrow morning. She will need neurologic checks per nursing protocol. Oncology consultation has been ordered. 2.  Urinary tract infection: Blood and urine cultures are pending. We will start Rocephin. 3.  Cholangiocarcinoma: She is followed by oncology. The new mass likely represents metastatic mass. She had been cancer-free for 6 months prior to this presentation. Further treatments to be determined by oncology.  4.  Disposition: The patient is currently a full code. 5.  prophylaxis: No pharmacological deep vein thrombosis prophylaxis at this time due to concern for edema and hemorrhage around the intracranial mass and also due to thrombocytopenia. Will use sequential compression devices. No gastrointestinal prophylaxis is needed at this time.  TIME SPENT ON ADMISSION: 45 minutes.    ____________________________  Earleen Newport. Volanda Napoleon, MD cpw:ST D: 08/06/2014 28:31:51 ET T: 08/06/2014 22:23:48 ET JOB#: [id number]  cc: Barnetta Chapel P. Volanda Napoleon, MD, <Dictator> Aldean Jewett MD ELECTRONICALLY SIGNED  08/08/2014 20:51

## 2014-08-29 ENCOUNTER — Ambulatory Visit: Payer: Medicare Other | Admitting: Radiation Oncology

## 2014-08-30 ENCOUNTER — Telehealth: Payer: Self-pay | Admitting: *Deleted

## 2014-08-30 ENCOUNTER — Ambulatory Visit
Admission: RE | Admit: 2014-08-30 | Discharge: 2014-08-30 | Disposition: A | Discharge status: Home / Self Care | Payer: Medicare Other | Source: Ambulatory Visit | Attending: Radiation Oncology | Admitting: Radiation Oncology

## 2014-08-30 DIAGNOSIS — Z510 Encounter for antineoplastic radiation therapy: Secondary | ICD-10-CM | POA: Insufficient documentation

## 2014-08-30 DIAGNOSIS — C23 Malignant neoplasm of gallbladder: Secondary | ICD-10-CM | POA: Diagnosis not present

## 2014-08-30 DIAGNOSIS — C787 Secondary malignant neoplasm of liver and intrahepatic bile duct: Secondary | ICD-10-CM | POA: Diagnosis not present

## 2014-08-30 MED ORDER — ALPRAZOLAM 0.25 MG PO TABS: 0.2500 mg | ORAL_TABLET | Freq: Four times a day (QID) | ORAL | Status: DC | PRN

## 2014-08-30 MED ORDER — TRAMADOL HCL 50 MG PO TABS: 50.0000 mg | ORAL_TABLET | Freq: Every day | ORAL | Status: DC

## 2014-08-30 NOTE — Telephone Encounter (Signed)
Rx sent to MD for approval and will fax to pt's pharmacy

## 2014-08-31 ENCOUNTER — Other Ambulatory Visit: Payer: Self-pay | Admitting: *Deleted

## 2014-08-31 ENCOUNTER — Ambulatory Visit
Admission: RE | Admit: 2014-08-31 | Discharge: 2014-08-31 | Disposition: A | Discharge status: Home / Self Care | Payer: Medicare Other | Source: Ambulatory Visit | Attending: Radiation Oncology | Admitting: Radiation Oncology

## 2014-08-31 DIAGNOSIS — C23 Malignant neoplasm of gallbladder: Secondary | ICD-10-CM

## 2014-08-31 DIAGNOSIS — Z510 Encounter for antineoplastic radiation therapy: Secondary | ICD-10-CM | POA: Diagnosis not present

## 2014-08-31 MED ORDER — LISINOPRIL 20 MG PO TABS: 20.0000 mg | ORAL_TABLET | Freq: Every day | ORAL | Status: DC

## 2014-08-31 NOTE — Telephone Encounter (Signed)
Refill on meds

## 2014-09-01 ENCOUNTER — Ambulatory Visit
Admission: RE | Admit: 2014-09-01 | Discharge: 2014-09-01 | Disposition: A | Discharge status: Home / Self Care | Payer: Medicare Other | Source: Ambulatory Visit | Attending: Radiation Oncology | Admitting: Radiation Oncology

## 2014-09-01 DIAGNOSIS — Z510 Encounter for antineoplastic radiation therapy: Secondary | ICD-10-CM | POA: Diagnosis not present

## 2014-09-02 ENCOUNTER — Other Ambulatory Visit: Payer: Self-pay | Admitting: *Deleted

## 2014-09-02 ENCOUNTER — Ambulatory Visit
Admission: RE | Admit: 2014-09-02 | Discharge: 2014-09-02 | Disposition: A | Discharge status: Home / Self Care | Payer: Medicare Other | Source: Ambulatory Visit | Attending: Radiation Oncology | Admitting: Radiation Oncology

## 2014-09-02 DIAGNOSIS — Z510 Encounter for antineoplastic radiation therapy: Secondary | ICD-10-CM | POA: Diagnosis not present

## 2014-09-02 MED ORDER — DEXAMETHASONE 4 MG PO TABS: 4.0000 mg | ORAL_TABLET | Freq: Two times a day (BID) | ORAL | Status: AC

## 2014-09-05 ENCOUNTER — Ambulatory Visit
Admission: RE | Admit: 2014-09-05 | Discharge: 2014-09-05 | Disposition: A | Discharge status: Home / Self Care | Payer: Medicare Other | Source: Ambulatory Visit | Attending: Radiation Oncology | Admitting: Radiation Oncology

## 2014-09-05 DIAGNOSIS — Z510 Encounter for antineoplastic radiation therapy: Secondary | ICD-10-CM | POA: Diagnosis not present

## 2014-09-06 ENCOUNTER — Ambulatory Visit
Admission: RE | Admit: 2014-09-06 | Discharge: 2014-09-06 | Disposition: A | Discharge status: Home / Self Care | Payer: Medicare Other | Source: Ambulatory Visit | Attending: Radiation Oncology | Admitting: Radiation Oncology

## 2014-09-06 DIAGNOSIS — Z510 Encounter for antineoplastic radiation therapy: Secondary | ICD-10-CM | POA: Diagnosis not present

## 2014-09-07 ENCOUNTER — Ambulatory Visit
Admission: RE | Admit: 2014-09-07 | Discharge: 2014-09-07 | Disposition: A | Discharge status: Home / Self Care | Payer: Medicare Other | Source: Ambulatory Visit | Attending: Radiation Oncology | Admitting: Radiation Oncology

## 2014-09-07 ENCOUNTER — Inpatient Hospital Stay: Payer: Medicare Other | Attending: Oncology

## 2014-09-07 DIAGNOSIS — C787 Secondary malignant neoplasm of liver and intrahepatic bile duct: Secondary | ICD-10-CM | POA: Insufficient documentation

## 2014-09-07 DIAGNOSIS — C7949 Secondary malignant neoplasm of other parts of nervous system: Secondary | ICD-10-CM

## 2014-09-07 DIAGNOSIS — C7931 Secondary malignant neoplasm of brain: Secondary | ICD-10-CM | POA: Diagnosis not present

## 2014-09-07 DIAGNOSIS — C7951 Secondary malignant neoplasm of bone: Secondary | ICD-10-CM | POA: Insufficient documentation

## 2014-09-07 DIAGNOSIS — Z510 Encounter for antineoplastic radiation therapy: Secondary | ICD-10-CM | POA: Diagnosis not present

## 2014-09-07 DIAGNOSIS — C23 Malignant neoplasm of gallbladder: Secondary | ICD-10-CM | POA: Insufficient documentation

## 2014-09-07 LAB — CBC
HCT: 44.7 % (ref 35.0–47.0)
Hemoglobin: 14.8 g/dL (ref 12.0–16.0)
MCH: 29.9 pg (ref 26.0–34.0)
MCHC: 33.1 g/dL (ref 32.0–36.0)
MCV: 90.3 fL (ref 80.0–100.0)
PLATELETS: 140 10*3/uL — AB (ref 150–440)
RBC: 4.96 MIL/uL (ref 3.80–5.20)
RDW: 17.1 % — ABNORMAL HIGH (ref 11.5–14.5)
WBC: 16.4 10*3/uL — AB (ref 3.6–11.0)

## 2014-09-08 ENCOUNTER — Ambulatory Visit
Admission: RE | Admit: 2014-09-08 | Discharge: 2014-09-08 | Disposition: A | Discharge status: Home / Self Care | Payer: Medicare Other | Source: Ambulatory Visit | Attending: Radiation Oncology | Admitting: Radiation Oncology

## 2014-09-08 DIAGNOSIS — Z510 Encounter for antineoplastic radiation therapy: Secondary | ICD-10-CM | POA: Diagnosis not present

## 2014-09-09 ENCOUNTER — Ambulatory Visit
Admission: RE | Admit: 2014-09-09 | Discharge: 2014-09-09 | Disposition: A | Discharge status: Home / Self Care | Payer: Medicare Other | Source: Ambulatory Visit | Attending: Radiation Oncology | Admitting: Radiation Oncology

## 2014-09-09 ENCOUNTER — Ambulatory Visit: Payer: Medicare Other

## 2014-09-09 ENCOUNTER — Encounter: Payer: Self-pay | Admitting: *Deleted

## 2014-09-12 ENCOUNTER — Ambulatory Visit
Admission: RE | Admit: 2014-09-12 | Discharge: 2014-09-12 | Disposition: A | Discharge status: Home / Self Care | Payer: Medicare Other | Source: Ambulatory Visit | Attending: Radiation Oncology | Admitting: Radiation Oncology

## 2014-09-13 ENCOUNTER — Ambulatory Visit
Admission: RE | Admit: 2014-09-13 | Discharge: 2014-09-13 | Disposition: A | Discharge status: Home / Self Care | Payer: Medicare Other | Source: Ambulatory Visit | Attending: Radiation Oncology | Admitting: Radiation Oncology

## 2014-09-14 ENCOUNTER — Inpatient Hospital Stay: Payer: Medicare Other | Attending: Radiation Oncology

## 2014-09-14 ENCOUNTER — Ambulatory Visit
Admission: RE | Admit: 2014-09-14 | Discharge: 2014-09-14 | Disposition: A | Discharge status: Home / Self Care | Payer: Medicare Other | Source: Ambulatory Visit | Attending: Radiation Oncology | Admitting: Radiation Oncology

## 2014-09-14 DIAGNOSIS — Z510 Encounter for antineoplastic radiation therapy: Secondary | ICD-10-CM | POA: Diagnosis not present

## 2014-09-15 ENCOUNTER — Ambulatory Visit
Admission: RE | Admit: 2014-09-15 | Discharge: 2014-09-15 | Disposition: A | Discharge status: Home / Self Care | Payer: Medicare Other | Source: Ambulatory Visit | Attending: Radiation Oncology | Admitting: Radiation Oncology

## 2014-09-15 DIAGNOSIS — Z510 Encounter for antineoplastic radiation therapy: Secondary | ICD-10-CM | POA: Diagnosis not present

## 2014-09-16 ENCOUNTER — Ambulatory Visit: Payer: Medicare Other

## 2014-09-19 ENCOUNTER — Ambulatory Visit: Payer: Medicare Other

## 2014-09-19 ENCOUNTER — Ambulatory Visit: Admission: RE | Admit: 2014-09-19 | Payer: Medicare Other | Source: Ambulatory Visit

## 2014-09-19 DIAGNOSIS — Z510 Encounter for antineoplastic radiation therapy: Secondary | ICD-10-CM | POA: Diagnosis not present

## 2014-09-20 ENCOUNTER — Ambulatory Visit
Admission: RE | Admit: 2014-09-20 | Discharge: 2014-09-20 | Disposition: A | Discharge status: Home / Self Care | Payer: Medicare Other | Source: Ambulatory Visit | Attending: Radiation Oncology | Admitting: Radiation Oncology

## 2014-09-20 DIAGNOSIS — Z510 Encounter for antineoplastic radiation therapy: Secondary | ICD-10-CM | POA: Diagnosis not present

## 2014-09-21 ENCOUNTER — Ambulatory Visit
Admission: RE | Admit: 2014-09-21 | Discharge: 2014-09-21 | Disposition: A | Discharge status: Home / Self Care | Payer: Medicare Other | Source: Ambulatory Visit | Attending: Radiation Oncology | Admitting: Radiation Oncology

## 2014-09-21 ENCOUNTER — Ambulatory Visit: Payer: Medicare Other

## 2014-09-21 DIAGNOSIS — Z510 Encounter for antineoplastic radiation therapy: Secondary | ICD-10-CM | POA: Diagnosis not present

## 2014-09-22 ENCOUNTER — Ambulatory Visit: Payer: Medicare Other

## 2014-09-22 ENCOUNTER — Ambulatory Visit
Admission: RE | Admit: 2014-09-22 | Discharge: 2014-09-22 | Disposition: A | Discharge status: Home / Self Care | Payer: Medicare Other | Source: Ambulatory Visit | Attending: Radiation Oncology | Admitting: Radiation Oncology

## 2014-09-22 DIAGNOSIS — Z510 Encounter for antineoplastic radiation therapy: Secondary | ICD-10-CM | POA: Diagnosis not present

## 2014-09-23 ENCOUNTER — Ambulatory Visit
Admission: RE | Admit: 2014-09-23 | Discharge: 2014-09-23 | Disposition: A | Discharge status: Home / Self Care | Payer: Medicare Other | Source: Ambulatory Visit | Attending: Radiation Oncology | Admitting: Radiation Oncology

## 2014-09-23 DIAGNOSIS — Z510 Encounter for antineoplastic radiation therapy: Secondary | ICD-10-CM | POA: Diagnosis not present

## 2014-10-26 ENCOUNTER — Ambulatory Visit: Payer: No Typology Code available for payment source | Admitting: Oncology

## 2014-10-26 ENCOUNTER — Other Ambulatory Visit: Payer: No Typology Code available for payment source

## 2014-10-26 ENCOUNTER — Ambulatory Visit: Payer: Medicare Other | Admitting: Radiation Oncology

## 2014-10-28 ENCOUNTER — Other Ambulatory Visit: Payer: Self-pay | Admitting: *Deleted

## 2014-10-28 DIAGNOSIS — C221 Intrahepatic bile duct carcinoma: Secondary | ICD-10-CM

## 2014-11-02 ENCOUNTER — Inpatient Hospital Stay: Payer: Medicare Other | Attending: Oncology

## 2014-11-02 ENCOUNTER — Ambulatory Visit
Admission: RE | Admit: 2014-11-02 | Discharge: 2014-11-02 | Disposition: A | Discharge status: Home / Self Care | Payer: Medicare Other | Source: Ambulatory Visit | Attending: Radiation Oncology | Admitting: Radiation Oncology

## 2014-11-02 ENCOUNTER — Inpatient Hospital Stay (HOSPITAL_BASED_OUTPATIENT_CLINIC_OR_DEPARTMENT_OTHER): Payer: Medicare Other | Admitting: Oncology

## 2014-11-02 ENCOUNTER — Encounter: Payer: Self-pay | Admitting: Radiation Oncology

## 2014-11-02 VITALS — BP 125/73 | HR 109 | Temp 97.1°F | Wt 175.3 lb

## 2014-11-02 DIAGNOSIS — C23 Malignant neoplasm of gallbladder: Secondary | ICD-10-CM | POA: Diagnosis present

## 2014-11-02 DIAGNOSIS — Z923 Personal history of irradiation: Secondary | ICD-10-CM | POA: Insufficient documentation

## 2014-11-02 DIAGNOSIS — C7931 Secondary malignant neoplasm of brain: Secondary | ICD-10-CM

## 2014-11-02 DIAGNOSIS — F418 Other specified anxiety disorders: Secondary | ICD-10-CM | POA: Diagnosis not present

## 2014-11-02 DIAGNOSIS — R531 Weakness: Secondary | ICD-10-CM | POA: Diagnosis not present

## 2014-11-02 DIAGNOSIS — Z9221 Personal history of antineoplastic chemotherapy: Secondary | ICD-10-CM | POA: Diagnosis not present

## 2014-11-02 DIAGNOSIS — Z79899 Other long term (current) drug therapy: Secondary | ICD-10-CM

## 2014-11-02 DIAGNOSIS — R5383 Other fatigue: Secondary | ICD-10-CM | POA: Diagnosis not present

## 2014-11-02 DIAGNOSIS — Z452 Encounter for adjustment and management of vascular access device: Secondary | ICD-10-CM | POA: Insufficient documentation

## 2014-11-02 DIAGNOSIS — C799 Secondary malignant neoplasm of unspecified site: Secondary | ICD-10-CM

## 2014-11-02 DIAGNOSIS — C221 Intrahepatic bile duct carcinoma: Secondary | ICD-10-CM

## 2014-11-02 LAB — COMPREHENSIVE METABOLIC PANEL
ALBUMIN: 4.1 g/dL (ref 3.5–5.0)
ALT: 23 U/L (ref 14–54)
ANION GAP: 8 (ref 5–15)
AST: 31 U/L (ref 15–41)
Alkaline Phosphatase: 62 U/L (ref 38–126)
BILIRUBIN TOTAL: 1 mg/dL (ref 0.3–1.2)
BUN: 10 mg/dL (ref 6–20)
CHLORIDE: 101 mmol/L (ref 101–111)
CO2: 26 mmol/L (ref 22–32)
Calcium: 9.2 mg/dL (ref 8.9–10.3)
Creatinine, Ser: 0.78 mg/dL (ref 0.44–1.00)
GFR calc Af Amer: >60 mL/min (ref >60)
GFR calc non Af Amer: >60 mL/min (ref >60)
Glucose, Bld: 107 mg/dL — ABNORMAL HIGH (ref 65–99)
POTASSIUM: 4.1 mmol/L (ref 3.5–5.1)
Sodium: 135 mmol/L (ref 135–145)
Total Protein: 7.1 g/dL (ref 6.5–8.1)

## 2014-11-02 LAB — CBC WITH DIFFERENTIAL/PLATELET
Basophils Absolute: 0 10*3/uL (ref 0–0.1)
Basophils Relative: 0 %
EOS ABS: 0.1 10*3/uL (ref 0–0.7)
EOS PCT: 2 %
HCT: 36.9 % (ref 35.0–47.0)
Hemoglobin: 12.2 g/dL (ref 12.0–16.0)
Lymphocytes Relative: 20 %
Lymphs Abs: 1.3 10*3/uL (ref 1.0–3.6)
MCH: 30.6 pg (ref 26.0–34.0)
MCHC: 33.1 g/dL (ref 32.0–36.0)
MCV: 92.7 fL (ref 80.0–100.0)
MONOS PCT: 14 %
Monocytes Absolute: 0.9 10*3/uL (ref 0.2–0.9)
Neutro Abs: 4.2 10*3/uL (ref 1.4–6.5)
Neutrophils Relative %: 64 %
PLATELETS: 284 10*3/uL (ref 150–440)
RBC: 3.97 MIL/uL (ref 3.80–5.20)
RDW: 17.4 % — ABNORMAL HIGH (ref 11.5–14.5)
WBC: 6.5 10*3/uL (ref 3.6–11.0)

## 2014-11-02 MED ORDER — CITALOPRAM HYDROBROMIDE 20 MG PO TABS: 20.0000 mg | ORAL_TABLET | Freq: Every day | ORAL | Status: DC

## 2014-11-02 NOTE — Progress Notes (Signed)
Radiation Oncology Follow up Note  Name: Marcia Collins   Date:   11/02/2014 MRN:  [id number] DOB: [birth year]    This 69 y.o. 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  REFERRING PROVIDER: Juline Patch, MD  HPI: Patient is a pleasant 69 year old female now 1 month out having completed whole brain plus boost for a large 6 cm complex mass in the left ventricle biopsy positive for poorly differentiated neoplasm compatible with gallbladder primary. She does have a history of a T1 N0 adenocarcinoma the gallbladder presenting in 2008. She is seen today in routine follow-up and is doing well she's been totally tapered over styloids. She's having no headaches change in visual fields or any motor sensory level changes. P when take is good according to her daughter her dementia may be slightly improved..  COMPLICATIONS OF TREATMENT: none  FOLLOW UP COMPLIANCE: keeps appointments   PHYSICAL EXAM:  There were no vitals taken for this visit. Well-developed female in NAD. Crude visual fields are within normal range cranial nerves II through XII are grossly intact. Oral cavity is clear no evidence of candidiasis. She does have early cataracts making funduscopic examination difficult. Motor sensory and DTR levels are equal and symmetric in the upper lower extremities. Proprioception is intact. Well-developed well-nourished patient in NAD. HEENT reveals PERLA, EOMI, discs not visualized.  Oral cavity is clear. No oral mucosal lesions are identified. Neck is clear without evidence of cervical or supraclavicular adenopathy. Lungs are clear to A&P. Cardiac examination is essentially unremarkable with regular rate and rhythm without murmur rub or thrill. Abdomen is benign with no organomegaly or masses noted. Motor sensory and DTR levels are equal and symmetric in the upper and lower extremities. Cranial nerves II through XII are grossly intact.  Proprioception is intact. No peripheral adenopathy or edema is identified. No motor or sensory levels are noted. Crude visual fields are within normal range.   RADIOLOGY RESULTS: No recent radiology results for review  PLAN: Present time she is doing well cover nicely from whole brain radiation plus boost. Her neurologic exam is unchanged. I have discussed the case personally with medical oncology. Further chemotherapy or other adjuvant treatments will be discussed and arranged in the future. I'm turning follow-up care over to medical oncology. I would be happy to reevaluate the patient any time should further palliative treatment be indicated.  I would like to take this opportunity for allowing me to participate in the care of your patient.Armstead Peaks., MD

## 2014-11-02 NOTE — Progress Notes (Signed)
Patient does not have living will.  Never smoked. Requesting refill for Xanax.

## 2014-11-03 LAB — CANCER ANTIGEN 19-9: CA 19-9: 95 U/mL — ABNORMAL HIGH (ref 0–35)

## 2014-11-05 ENCOUNTER — Encounter: Payer: Self-pay | Admitting: Oncology

## 2014-11-05 DIAGNOSIS — C23 Malignant neoplasm of gallbladder: Secondary | ICD-10-CM

## 2014-11-05 DIAGNOSIS — C799 Secondary malignant neoplasm of unspecified site: Secondary | ICD-10-CM

## 2014-11-05 HISTORY — DX: Secondary malignant neoplasm of unspecified site: C79.9

## 2014-11-05 HISTORY — DX: Malignant neoplasm of gallbladder: C23

## 2014-11-05 NOTE — Progress Notes (Signed)
[place] @ [facility] [phone]  Fax:([phone] [place]: Apr 04, 1946  MR#: [id number]  JYN#:[id number]  Patient Care Team: Juline Patch, MD as PCP - General (Family Medicine) Forest Gleason, MD (Unknown Physician Specialty) Christene Lye, MD (General Surgery)  CHIEF COMPLAINT:  Chief Complaint  Patient presents with  . Follow-up    Oncology History   1. History of gallbladder carcinoma.Diagnosis in February of 2008 resection of poor to lymph node, resection of liver week for and findings segment and partial duodenal resection done at [facility] Final staging was T1, N0, M0 stage I gallbladder carcinoma. no followup adjuvant chemotherapy or radiation therapy. 2. Has abnormal MRI scan of the liver consistent with multiple metastases (September, 2014) 3. 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  Had resection of the right abdominal wall nodule.  (October, 2015) 8.  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  Biopsy was done at [facility]  Patient received radiation therapy (May of 2016)     Carcinoma gallbladder    Extremely 69-year-old lady with stage IV carcinoma, bladder metastases to the brain status post biopsy at [facility] status post radiation therapy.  Patient is here for further follow-up and treatment consideration.  Patient is off steroids now.  Appetite is improving.  No nausea.  No vomiting.  No diarrhea.  Continues to some problems with memory. INTERVAL HISTORY: \ Extremity  69-year-old lady came today further follow-up.  As described in history and physical patient is gradually improving from radiation therapy to the brain.  Family is here and had number of questions.   REVIEW OF SYSTEMS:    general status: Patient is feeling weak and tired.  No change in a performance status.  No chills.  No fever. HEENT: Alopecia.  No evidence of stomatitis Lungs: No cough or shortness of breath Cardiac: No chest pain or paroxysmal nocturnal dyspnea GI: No nausea no vomiting no diarrhea no abdominal pain Skin: No rash Lower extremity no swelling Neurological system: No tingling.  No numbness.  No other focal signs Musculoskeletal system no bony pains  As per HPI. Otherwise, a complete review of systems is negatve.  PAST MEDICAL HISTORY: Past Medical History  Diagnosis Date  . Cancer 2007    gallbladder    PAST SURGICAL HISTORY: Past Surgical History  Procedure Laterality Date  . Cholecystectomy  03/2006  . Hand surgery Right 2012  . Portacath placement  01/2013  . Colonoscopy  2013  . Mass excision  02-01-14    abdominal wall    FAMILY HISTORY Family History  Problem Relation Age of Onset  . Other Father     brain tumor    ADVANCED DIRECTIVES:  Patient does not have any living will or healthcare power of attorney.  Information was given .  Available resources had been discussed.  We will follow-up on subsequent appointments regarding this issue HEALTH MAINTENANCE: History  Substance Use Topics  . Smoking status: Never Smoker   . Smokeless tobacco: Never Used  . Alcohol Use: No  No Known Allergies  Current Outpatient Prescriptions  Medication Sig Dispense Refill  . ALPRAZolam (XANAX) 0.25 MG tablet Take 1 tablet (0.25 mg total) by mouth every 6 (six) hours as needed for anxiety. 30 tablet 3  . lisinopril (PRINIVIL,ZESTRIL) 20 MG tablet Take 1 tablet (20 mg total) by mouth daily. 30 tablet 3  . traMADol (ULTRAM) 50 MG tablet Take 1 tablet (50 mg  total) by mouth daily. 30 tablet 3  . citalopram (CELEXA) 20 MG tablet Take 1 tablet (20 mg total) by mouth daily. 30 tablet 3   No current facility-administered medications for this visit.    OBJECTIVE:     Body mass index is 28.3 kg/(m^2).    ECOG FS:1 - Symptomatic but completely ambulatory  PHYSICAL EXAM: GENERAL:  Well developed, well nourished, sitting comfortably in the exam room in no acute distress. MENTAL STATUS:  Alert and oriented to person, place and time. HEAD:  alopecia.  Normocephalic, atraumatic, face symmetric, no Cushingoid features. 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  Pupils equal round and reactive to light and accomodation.  No conjunctivitis or scleral icterus. ENT:  Oropharynx clear without lesion.  Tongue normal. Mucous membranes moist.  RESPIRATORY:  Clear to auscultation without rales, wheezes or rhonchi. CARDIOVASCULAR:  Regular rate and rhythm without murmur, rub or gallop.  ABDOMEN:  Soft, non-tender, with active bowel sounds, and no hepatosplenomegaly.  No masses. No palpable masses BACK:  No CVA tenderness.  No tenderness on percussion of the back or rib cage. SKIN:  No rashes, ulcers or lesions. EXTREMITIES: No edema, no skin discoloration or tenderness.  No palpable cords. LYMPH NODES: No palpable cervical, supraclavicular, axillary or inguinal adenopathy  NEUROLOGICAL: Unremarkable. PSYCH:  He should not is also extremely anxious and depressed so Celexa has been started   LAB RESULTS:  Appointment on 11/02/2014  Component Date Value Ref Range Status  . WBC 11/02/2014 6.5  3.6 - 11.0 K/uL Final  . RBC 11/02/2014 3.97  3.80 - 5.20 MIL/uL Final  . Hemoglobin 11/02/2014 12.2  12.0 - 16.0 g/dL Final  . HCT 11/02/2014 36.9  35.0 - 47.0 % Final  . MCV 11/02/2014 92.7  80.0 - 100.0 fL Final  . MCH 11/02/2014 30.6  26.0 - 34.0 pg Final  . MCHC 11/02/2014 33.1  32.0 - 36.0 g/dL Final  . RDW 11/02/2014 17.4* 11.5 - 14.5 % Final  .  Platelets 11/02/2014 284  150 - 440 K/uL Final  . Neutrophils Relative % 11/02/2014 64   Final  . Neutro Abs 11/02/2014 4.2  1.4 - 6.5 K/uL Final  . Lymphocytes Relative 11/02/2014 20   Final  . Lymphs Abs 11/02/2014 1.3  1.0 - 3.6 K/uL Final  . Monocytes Relative 11/02/2014 14   Final  . Monocytes Absolute 11/02/2014 0.9  0.2 - 0.9 K/uL Final  . Eosinophils Relative 11/02/2014 2   Final  . Eosinophils Absolute 11/02/2014 0.1  0 - 0.7 K/uL Final  . Basophils Relative 11/02/2014 0   Final  . Basophils Absolute 11/02/2014 0.0  0 - 0.1 K/uL Final  . Sodium 11/02/2014 135  135 - 145 mmol/L Final  . Potassium 11/02/2014 4.1  3.5 - 5.1 mmol/L Final  . Chloride 11/02/2014 101  101 - 111 mmol/L Final  . CO2 11/02/2014 26  22 - 32 mmol/L Final  . Glucose, Bld 11/02/2014 107* 65 - 99 mg/dL Final  . BUN 11/02/2014 10  6 - 20 mg/dL Final  . Creatinine,  Ser 11/02/2014 0.78  0.44 - 1.00 mg/dL Final  . Calcium 11/02/2014 9.2  8.9 - 10.3 mg/dL Final  . Total Protein 11/02/2014 7.1  6.5 - 8.1 g/dL Final  . Albumin 11/02/2014 4.1  3.5 - 5.0 g/dL Final  . AST 11/02/2014 31  15 - 41 U/L Final  . ALT 11/02/2014 23  14 - 54 U/L Final  . Alkaline Phosphatase 11/02/2014 62  38 - 126 U/L Final  . Total Bilirubin 11/02/2014 1.0  0.3 - 1.2 mg/dL Final  . GFR calc non Af Amer 11/02/2014 >60  >60 mL/min Final  . GFR calc Af Amer 11/02/2014 >60  >60 mL/min Final   Comment: (NOTE) The eGFR has been calculated using the CKD EPI equation. This calculation has not been validated in all clinical situations. eGFR's persistently <60 mL/min signify possible Chronic Kidney Disease.   . Anion gap 11/02/2014 8  5 - 15 Final  . CA 19-9 11/02/2014 95* 0 - 35 U/mL Final   Comment: (NOTE) Roche ECLIA methodology Performed At: [facility] [address] [place], [place] [id number] Lindon Romp MD MB:[id number]      ASSESSMENT: Stage IV carcinoma of the gallbladder status post abdominal wall mass excision  no metastases to the brain biopsies positive of that of brain lesion consistent with adenocarcinoma status post radiation therapy Depression and anxiety.  Patient is on Xanax and Celexa has been started patient will taper off Xanax in next few days  MEDICAL DECISION MAKING:  All lab data has been reviewed. All previous records from [facility] and radiation Department has been reviewed. Patient is off steroid I had prolonged discussion with patient and family.  The tumor markers are high but stable Head complete CT scan done at [facility] 2 months ago and it did not show any evidence of recurrent or progressive disease This point in time we will repeat tumor markers since 8 weeks.  If it is rising than PET scan would be done.  Will also repeat MRI scan of brain.  This was discussed with the family.  Daughter-in-law was present. Patient was advised to call me if there is any significant new problem Total duration of visit was 45 minutes.  50% or more time was spent in counseling patient and family regarding prognosis and options of treatment and available resources Case was also discussed with Dr. Donella Stade for radiation Department and also GI oncology department at [facility]  Patient expressed understanding and was in agreement with this plan. She also understands that She can call clinic at any time with any questions, concerns, or complaints.    No matching staging information was found for the patient.  Forest Gleason, MD   11/05/2014 4:55 PM

## 2014-11-15 ENCOUNTER — Inpatient Hospital Stay: Payer: Medicare Other

## 2014-11-15 DIAGNOSIS — Z95828 Presence of other vascular implants and grafts: Secondary | ICD-10-CM

## 2014-11-15 DIAGNOSIS — C23 Malignant neoplasm of gallbladder: Secondary | ICD-10-CM | POA: Diagnosis not present

## 2014-11-15 MED ORDER — HEPARIN SOD (PORK) LOCK FLUSH 100 UNIT/ML IV SOLN
500.0000 [IU] | Freq: Once | INTRAVENOUS | Status: AC
  Administered 2014-11-15: 500 [IU] via INTRAVENOUS
  Filled 2014-11-15: qty 5

## 2014-11-15 MED ORDER — SODIUM CHLORIDE 0.9 % IJ SOLN
10.0000 mL | INTRAMUSCULAR | Status: DC | PRN
  Administered 2014-11-15: 10 mL via INTRAVENOUS
  Filled 2014-11-15: qty 10

## 2014-11-29 ENCOUNTER — Encounter: Payer: Self-pay | Admitting: Oncology

## 2014-12-17 ENCOUNTER — Other Ambulatory Visit: Payer: Self-pay | Admitting: Oncology

## 2014-12-19 ENCOUNTER — Telehealth: Payer: Self-pay | Admitting: Oncology

## 2014-12-19 NOTE — Telephone Encounter (Signed)
Pt aware that xanax refill will be sent to [facility] pharmacy and can check with pharmacy this afternoon. Pt verbalized understanding.

## 2014-12-19 NOTE — Telephone Encounter (Signed)
She needs a Rx for her Marcia Collins because she is out as of today. Please advise. She doesn't know if you can call this in or if she has to get hard copy Rx. Thanks.

## 2014-12-27 ENCOUNTER — Inpatient Hospital Stay: Payer: Medicare Other | Attending: Oncology

## 2014-12-27 DIAGNOSIS — Z452 Encounter for adjustment and management of vascular access device: Secondary | ICD-10-CM | POA: Insufficient documentation

## 2014-12-27 DIAGNOSIS — C7931 Secondary malignant neoplasm of brain: Secondary | ICD-10-CM | POA: Diagnosis not present

## 2014-12-27 DIAGNOSIS — Z9221 Personal history of antineoplastic chemotherapy: Secondary | ICD-10-CM | POA: Insufficient documentation

## 2014-12-27 DIAGNOSIS — C23 Malignant neoplasm of gallbladder: Secondary | ICD-10-CM | POA: Insufficient documentation

## 2014-12-27 DIAGNOSIS — Z95828 Presence of other vascular implants and grafts: Secondary | ICD-10-CM

## 2014-12-27 MED ORDER — SODIUM CHLORIDE 0.9 % IJ SOLN
10.0000 mL | INTRAMUSCULAR | Status: DC | PRN
  Administered 2014-12-27: 10 mL via INTRAVENOUS
  Filled 2014-12-27: qty 10

## 2014-12-27 MED ORDER — HEPARIN SOD (PORK) LOCK FLUSH 100 UNIT/ML IV SOLN
500.0000 [IU] | Freq: Once | INTRAVENOUS | Status: AC
  Administered 2014-12-27: 500 [IU] via INTRAVENOUS
  Filled 2014-12-27: qty 5

## 2014-12-28 ENCOUNTER — Telehealth: Payer: Self-pay | Admitting: Oncology

## 2014-12-28 DIAGNOSIS — C23 Malignant neoplasm of gallbladder: Secondary | ICD-10-CM

## 2014-12-28 MED ORDER — CITALOPRAM HYDROBROMIDE 20 MG PO TABS: 20.0000 mg | ORAL_TABLET | Freq: Every day | ORAL | Status: AC

## 2014-12-28 MED ORDER — LISINOPRIL 20 MG PO TABS: 20.0000 mg | ORAL_TABLET | Freq: Every day | ORAL | Status: AC

## 2014-12-28 NOTE — Telephone Encounter (Signed)
Refills escribed to pharmacy.

## 2014-12-28 NOTE — Telephone Encounter (Signed)
Attempted to call patient to inform her that prescriptions have been e-scribed.  No answer and no answering service.

## 2014-12-28 NOTE — Telephone Encounter (Signed)
She needs Rx refill for Lisinopril 20 mg and Citalopran 20 mg. Thanks.

## 2015-01-03 ENCOUNTER — Ambulatory Visit: Payer: Medicare Other

## 2015-01-03 ENCOUNTER — Ambulatory Visit
Admission: RE | Admit: 2015-01-03 | Discharge: 2015-01-03 | Disposition: A | Discharge status: Home / Self Care | Payer: Medicare Other | Source: Ambulatory Visit | Attending: Oncology | Admitting: Oncology

## 2015-01-03 DIAGNOSIS — C23 Malignant neoplasm of gallbladder: Secondary | ICD-10-CM | POA: Diagnosis present

## 2015-01-03 DIAGNOSIS — C7989 Secondary malignant neoplasm of other specified sites: Secondary | ICD-10-CM | POA: Insufficient documentation

## 2015-01-03 DIAGNOSIS — Z08 Encounter for follow-up examination after completed treatment for malignant neoplasm: Secondary | ICD-10-CM | POA: Insufficient documentation

## 2015-01-03 DIAGNOSIS — C7931 Secondary malignant neoplasm of brain: Secondary | ICD-10-CM | POA: Diagnosis present

## 2015-01-03 MED ORDER — GADOBENATE DIMEGLUMINE 529 MG/ML IV SOLN
15.0000 mL | Freq: Once | INTRAVENOUS | Status: AC | PRN
  Administered 2015-01-03: 15 mL via INTRAVENOUS

## 2015-01-10 ENCOUNTER — Encounter: Payer: Self-pay | Admitting: Oncology

## 2015-01-10 ENCOUNTER — Ambulatory Visit: Payer: Medicare Other

## 2015-01-10 ENCOUNTER — Inpatient Hospital Stay: Payer: Medicare Other | Attending: Oncology | Admitting: Oncology

## 2015-01-10 ENCOUNTER — Inpatient Hospital Stay: Payer: Medicare Other

## 2015-01-10 VITALS — BP 151/84 | HR 77 | Temp 97.4°F | Resp 20 | Wt 176.0 lb

## 2015-01-10 DIAGNOSIS — C7949 Secondary malignant neoplasm of other parts of nervous system: Principal | ICD-10-CM

## 2015-01-10 DIAGNOSIS — Z923 Personal history of irradiation: Secondary | ICD-10-CM | POA: Insufficient documentation

## 2015-01-10 DIAGNOSIS — R5383 Other fatigue: Secondary | ICD-10-CM | POA: Insufficient documentation

## 2015-01-10 DIAGNOSIS — C23 Malignant neoplasm of gallbladder: Secondary | ICD-10-CM

## 2015-01-10 DIAGNOSIS — Z79899 Other long term (current) drug therapy: Secondary | ICD-10-CM | POA: Diagnosis not present

## 2015-01-10 DIAGNOSIS — F418 Other specified anxiety disorders: Secondary | ICD-10-CM | POA: Diagnosis not present

## 2015-01-10 DIAGNOSIS — Z9221 Personal history of antineoplastic chemotherapy: Secondary | ICD-10-CM | POA: Diagnosis not present

## 2015-01-10 DIAGNOSIS — C787 Secondary malignant neoplasm of liver and intrahepatic bile duct: Secondary | ICD-10-CM | POA: Insufficient documentation

## 2015-01-10 DIAGNOSIS — Z1231 Encounter for screening mammogram for malignant neoplasm of breast: Secondary | ICD-10-CM

## 2015-01-10 DIAGNOSIS — R413 Other amnesia: Secondary | ICD-10-CM | POA: Diagnosis not present

## 2015-01-10 DIAGNOSIS — C7931 Secondary malignant neoplasm of brain: Secondary | ICD-10-CM | POA: Diagnosis not present

## 2015-01-10 DIAGNOSIS — R531 Weakness: Secondary | ICD-10-CM | POA: Diagnosis not present

## 2015-01-10 DIAGNOSIS — Z23 Encounter for immunization: Secondary | ICD-10-CM | POA: Insufficient documentation

## 2015-01-10 LAB — CBC WITH DIFFERENTIAL/PLATELET
Basophils Absolute: 0 10*3/uL (ref 0–0.1)
Basophils Relative: 0 %
Eosinophils Absolute: 0.1 10*3/uL (ref 0–0.7)
Eosinophils Relative: 2 %
HEMATOCRIT: 42.1 % (ref 35.0–47.0)
Hemoglobin: 13.9 g/dL (ref 12.0–16.0)
LYMPHS PCT: 15 %
Lymphs Abs: 0.9 10*3/uL — ABNORMAL LOW (ref 1.0–3.6)
MCH: 29.6 pg (ref 26.0–34.0)
MCHC: 33.1 g/dL (ref 32.0–36.0)
MCV: 89.3 fL (ref 80.0–100.0)
MONO ABS: 0.9 10*3/uL (ref 0.2–0.9)
MONOS PCT: 16 %
NEUTROS ABS: 3.8 10*3/uL (ref 1.4–6.5)
Neutrophils Relative %: 67 %
Platelets: 145 10*3/uL — ABNORMAL LOW (ref 150–440)
RBC: 4.71 MIL/uL (ref 3.80–5.20)
RDW: 13.2 % (ref 11.5–14.5)
WBC: 5.7 10*3/uL (ref 3.6–11.0)

## 2015-01-10 LAB — COMPREHENSIVE METABOLIC PANEL
ALT: 28 U/L (ref 14–54)
ANION GAP: 7 (ref 5–15)
AST: 32 U/L (ref 15–41)
Albumin: 4.1 g/dL (ref 3.5–5.0)
Alkaline Phosphatase: 68 U/L (ref 38–126)
BILIRUBIN TOTAL: 0.8 mg/dL (ref 0.3–1.2)
BUN: 9 mg/dL (ref 6–20)
CO2: 27 mmol/L (ref 22–32)
Calcium: 8.7 mg/dL — ABNORMAL LOW (ref 8.9–10.3)
Chloride: 102 mmol/L (ref 101–111)
Creatinine, Ser: 0.72 mg/dL (ref 0.44–1.00)
Glucose, Bld: 100 mg/dL — ABNORMAL HIGH (ref 65–99)
POTASSIUM: 3.9 mmol/L (ref 3.5–5.1)
Sodium: 136 mmol/L (ref 135–145)
TOTAL PROTEIN: 7 g/dL (ref 6.5–8.1)

## 2015-01-10 LAB — MAGNESIUM: Magnesium: 1.8 mg/dL (ref 1.7–2.4)

## 2015-01-10 MED ORDER — INFLUENZA VAC SPLIT QUAD 0.5 ML IM SUSY
0.5000 mL | PREFILLED_SYRINGE | Freq: Once | INTRAMUSCULAR | Status: AC
  Administered 2015-01-10: 0.5 mL via INTRAMUSCULAR

## 2015-01-10 NOTE — Progress Notes (Signed)
Marcia Collins @ [facility] Telephone:([phone]) [phone]  Fax:(336) [place]: 17-Mar-1946  MR#: [id number]  HUD#:[id number]  Patient Care Team: Juline Patch, MD as PCP - General (Family Medicine) Forest Gleason, MD (Unknown Physician Specialty) Christene Lye, MD (General Surgery)  CHIEF COMPLAINT:  Chief Complaint  Patient presents with  . Follow-up    MRI Results    Oncology History   1. History of gallbladder carcinoma.Diagnosis in February of 2008 resection of poor to lymph node, resection of liver week for and findings segment and partial duodenal resection done at [facility] Final staging was T1, N0, M0 stage I gallbladder carcinoma. no followup adjuvant chemotherapy or radiation therapy. 2. Has abnormal MRI scan of the liver consistent with multiple metastases (September, 2014) 3. 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  Had resection of the right abdominal wall nodule.  (October, 2015) 8.  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  Biopsy was done at [facility].  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  Patient is here for further follow-up and treatment consideration.  Patient is off steroids now.  Appetite is improving.  No nausea.  No vomiting.  No diarrhea.  Continues to some problems with  memory. INTERVAL HISTORY: \ Extremity 69-year-old lady came today further follow-up.  As described in history and physical patient is gradually improving from radiation therapy to the brain.  Family is here and had number of questions.   January 10, 2015 Marcia Collins came today further follow-up.  Patient had a radiation therapy and a repeat MRI scan.  Family complains about loss of memory.  Patient and family here to discuss the results of the MRI scan.  Patient denies any loss of appetite or weight. No abdominal pain.  No nausea no vomiting.  Alopecia persist REVIEW OF SYSTEMS:    general status: Patient is feeling weak and tired.  No change in a performance status.  No chills.  No fever. HEENT: Alopecia.  No evidence of stomatitis Lungs: No cough or shortness of breath Cardiac: No chest pain or paroxysmal nocturnal dyspnea GI: No nausea no vomiting no diarrhea no abdominal pain Skin: No rash Lower extremity no swelling Neurological system: No tingling.  No numbness.  No other focal signs Musculoskeletal system no bony pains  As per HPI. Otherwise, a complete review of systems is negatve.  PAST MEDICAL HISTORY: Past Medical History  Diagnosis Date  . Cancer 2007    gallbladder  . Metastasis from gallbladder cancer 11/05/2014    PAST SURGICAL HISTORY: Past Surgical History  Procedure Laterality Date  . Cholecystectomy  03/2006  . Hand surgery Right 2012  . Portacath placement  01/2013  . Colonoscopy  2013  . Mass excision  02-01-14    abdominal wall    FAMILY HISTORY Family History  Problem Relation Age of Onset  .  Other Father     brain tumor    ADVANCED DIRECTIVES:  Patient does not have any living will or healthcare power of attorney.  Information was given .  Available resources had been discussed.  We will follow-up on subsequent appointments regarding this issue HEALTH MAINTENANCE: Social History  Substance Use Topics  . Smoking status: Never Smoker   . Smokeless  tobacco: Never Used  . Alcohol Use: No      No Known Allergies  Current Outpatient Prescriptions  Medication Sig Dispense Refill  . ALPRAZolam (XANAX) 0.25 MG tablet TAKE ONE TABLET BY MOUTH EVERY 6 HOURS AS NEEDED FOR ANXIETY 30 tablet 0  . citalopram (CELEXA) 20 MG tablet Take 1 tablet (20 mg total) by mouth daily. 30 tablet 3  . lisinopril (PRINIVIL,ZESTRIL) 20 MG tablet Take 1 tablet (20 mg total) by mouth daily. 30 tablet 3  . traMADol (ULTRAM) 50 MG tablet Take 1 tablet (50 mg total) by mouth daily. 30 tablet 3   No current facility-administered medications for this visit.    OBJECTIVE:     Body mass index is 28.42 kg/(m^2).    ECOG FS:1 - Symptomatic but completely ambulatory  PHYSICAL EXAM: GENERAL:  Well developed, well nourished, sitting comfortably in the exam room in no acute distress. MENTAL STATUS:  Alert and oriented to person, place and time. HEAD:  alopecia.  Normocephalic, atraumatic, face symmetric, no Cushingoid features. 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  Pupils equal round and reactive to light and accomodation.  No conjunctivitis or scleral icterus. ENT:  Oropharynx clear without lesion.  Tongue normal. Mucous membranes moist.  RESPIRATORY:  Clear to auscultation without rales, wheezes or rhonchi. CARDIOVASCULAR:  Regular rate and rhythm without murmur, rub or gallop.  ABDOMEN:  Soft, non-tender, with active bowel sounds, and no hepatosplenomegaly.  No masses. No palpable masses BACK:  No CVA tenderness.  No tenderness on percussion of the back or rib cage. SKIN:  No rashes, ulcers or lesions. EXTREMITIES: No edema, no skin discoloration or tenderness.  No palpable cords. LYMPH NODES: No palpable cervical, supraclavicular, axillary or inguinal adenopathy  NEUROLOGICAL: Unremarkable. PSYCH:  He should not is also extremely anxious and depressed so Celexa has been started   LAB RESULTS:  Appointment on 01/10/2015  Component Date  Value Ref Range Status  . WBC 01/10/2015 5.7  3.6 - 11.0 K/uL Final  . RBC 01/10/2015 4.71  3.80 - 5.20 MIL/uL Final  . Hemoglobin 01/10/2015 13.9  12.0 - 16.0 g/dL Final  . HCT 01/10/2015 42.1  35.0 - 47.0 % Final  . MCV 01/10/2015 89.3  80.0 - 100.0 fL Final  . MCH 01/10/2015 29.6  26.0 - 34.0 pg Final  . MCHC 01/10/2015 33.1  32.0 - 36.0 g/dL Final  . RDW 01/10/2015 13.2  11.5 - 14.5 % Final  . Platelets 01/10/2015 145* 150 - 440 K/uL Final  . Neutrophils Relative % 01/10/2015 67   Final  . Neutro Abs 01/10/2015 3.8  1.4 - 6.5 K/uL Final  . Lymphocytes Relative 01/10/2015 15   Final  . Lymphs Abs 01/10/2015 0.9* 1.0 - 3.6 K/uL Final  . Monocytes Relative 01/10/2015 16   Final  . Monocytes Absolute 01/10/2015 0.9  0.2 - 0.9 K/uL Final  . Eosinophils Relative 01/10/2015 2   Final  . Eosinophils Absolute 01/10/2015 0.1  0 - 0.7 K/uL Final  . Basophils Relative 01/10/2015 0   Final  . Basophils Absolute 01/10/2015 0.0  0 -  0.1 K/uL Final  . Sodium 01/10/2015 136  135 - 145 mmol/L Final  . Potassium 01/10/2015 3.9  3.5 - 5.1 mmol/L Final  . Chloride 01/10/2015 102  101 - 111 mmol/L Final  . CO2 01/10/2015 27  22 - 32 mmol/L Final  . Glucose, Bld 01/10/2015 100* 65 - 99 mg/dL Final  . BUN 01/10/2015 9  6 - 20 mg/dL Final  . Creatinine, Ser 01/10/2015 0.72  0.44 - 1.00 mg/dL Final  . Calcium 01/10/2015 8.7* 8.9 - 10.3 mg/dL Final  . Total Protein 01/10/2015 7.0  6.5 - 8.1 g/dL Final  . Albumin 01/10/2015 4.1  3.5 - 5.0 g/dL Final  . AST 01/10/2015 32  15 - 41 U/L Final  . ALT 01/10/2015 28  14 - 54 U/L Final  . Alkaline Phosphatase 01/10/2015 68  38 - 126 U/L Final  . Total Bilirubin 01/10/2015 0.8  0.3 - 1.2 mg/dL Final  . GFR calc non Af Amer 01/10/2015 >60  >60 mL/min Final  . GFR calc Af Amer 01/10/2015 >60  >60 mL/min Final   Comment: (NOTE) The eGFR has been calculated using the CKD EPI equation. This calculation has not been validated in all clinical situations. eGFR's  persistently <60 mL/min signify possible Chronic Kidney Disease.   . Anion gap 01/10/2015 7  5 - 15 Final  . Magnesium 01/10/2015 1.8  1.7 - 2.4 mg/dL Final     ASSESSMENT: Stage IV carcinoma of the gallbladder status post abdominal wall mass excision no metastases to the brain biopsies positive of that of brain lesion consistent with adenocarcinoma status post radiation therapy Depression and anxiety.   Patient has been taken off Xanax Continue Celexa Bilateral screening mammogram PET scan for complete restaging as tumor markers are rising All other lab data has been reviewed./ 2.  Memory loss may be delayed effect of radiation therapy will continue to monitor MEDICAL DECISION MAKING:  All lab data has been reviewed. No abnormality detected MRI scan has been reviewed independently and reviewed with the patient Cystic mass persist may be smaller than before   Patient expressed understanding and was in agreement with this plan. She also understands that She can call clinic at any time with any questions, concerns, or complaints.    No matching staging information was found for the patient.  Forest Gleason, MD   01/10/2015 11:46 AM

## 2015-01-11 LAB — CANCER ANTIGEN 19-9: CA 19 9: 176 U/mL — AB (ref 0–35)

## 2015-01-23 ENCOUNTER — Inpatient Hospital Stay: Payer: Medicare Other | Attending: Oncology

## 2015-01-23 DIAGNOSIS — C787 Secondary malignant neoplasm of liver and intrahepatic bile duct: Secondary | ICD-10-CM | POA: Diagnosis not present

## 2015-01-23 DIAGNOSIS — Z9221 Personal history of antineoplastic chemotherapy: Secondary | ICD-10-CM | POA: Insufficient documentation

## 2015-01-23 DIAGNOSIS — Z95828 Presence of other vascular implants and grafts: Secondary | ICD-10-CM

## 2015-01-23 DIAGNOSIS — Z452 Encounter for adjustment and management of vascular access device: Secondary | ICD-10-CM | POA: Insufficient documentation

## 2015-01-23 DIAGNOSIS — C7931 Secondary malignant neoplasm of brain: Secondary | ICD-10-CM | POA: Insufficient documentation

## 2015-01-23 DIAGNOSIS — Z923 Personal history of irradiation: Secondary | ICD-10-CM | POA: Insufficient documentation

## 2015-01-23 DIAGNOSIS — C23 Malignant neoplasm of gallbladder: Secondary | ICD-10-CM | POA: Diagnosis not present

## 2015-01-23 MED ORDER — HEPARIN SOD (PORK) LOCK FLUSH 100 UNIT/ML IV SOLN
500.0000 [IU] | Freq: Once | INTRAVENOUS | Status: AC
  Administered 2015-01-23: 500 [IU] via INTRAVENOUS

## 2015-01-23 MED ORDER — SODIUM CHLORIDE 0.9 % IJ SOLN
10.0000 mL | INTRAMUSCULAR | Status: DC | PRN
  Administered 2015-01-23: 10 mL via INTRAVENOUS
  Filled 2015-01-23: qty 10

## 2015-02-07 ENCOUNTER — Inpatient Hospital Stay: Payer: Medicare Other

## 2015-02-16 ENCOUNTER — Ambulatory Visit
Admission: RE | Admit: 2015-02-16 | Discharge: 2015-02-16 | Disposition: A | Discharge status: Home / Self Care | Payer: Medicare Other | Source: Ambulatory Visit | Attending: Oncology | Admitting: Oncology

## 2015-02-16 DIAGNOSIS — Z9049 Acquired absence of other specified parts of digestive tract: Secondary | ICD-10-CM | POA: Insufficient documentation

## 2015-02-16 DIAGNOSIS — I700 Atherosclerosis of aorta: Secondary | ICD-10-CM | POA: Insufficient documentation

## 2015-02-16 DIAGNOSIS — C23 Malignant neoplasm of gallbladder: Secondary | ICD-10-CM | POA: Diagnosis not present

## 2015-02-16 DIAGNOSIS — K5730 Diverticulosis of large intestine without perforation or abscess without bleeding: Secondary | ICD-10-CM | POA: Diagnosis not present

## 2015-02-16 DIAGNOSIS — Z79899 Other long term (current) drug therapy: Secondary | ICD-10-CM | POA: Insufficient documentation

## 2015-02-16 LAB — GLUCOSE, CAPILLARY: GLUCOSE-CAPILLARY: 91 mg/dL (ref 65–99)

## 2015-02-16 MED ORDER — FLUDEOXYGLUCOSE F - 18 (FDG) INJECTION
11.9500 | Freq: Once | INTRAVENOUS | Status: DC | PRN
  Administered 2015-02-16: 11.95 via INTRAVENOUS
  Filled 2015-02-16: qty 11.95

## 2015-02-20 ENCOUNTER — Inpatient Hospital Stay (HOSPITAL_BASED_OUTPATIENT_CLINIC_OR_DEPARTMENT_OTHER): Payer: Medicare Other | Admitting: Oncology

## 2015-02-20 ENCOUNTER — Encounter: Payer: Self-pay | Admitting: Oncology

## 2015-02-20 ENCOUNTER — Inpatient Hospital Stay: Payer: Medicare Other | Attending: Oncology

## 2015-02-20 VITALS — BP 139/95 | HR 68 | Temp 97.4°F | Wt 176.6 lb

## 2015-02-20 DIAGNOSIS — R531 Weakness: Secondary | ICD-10-CM

## 2015-02-20 DIAGNOSIS — Z9221 Personal history of antineoplastic chemotherapy: Secondary | ICD-10-CM | POA: Diagnosis not present

## 2015-02-20 DIAGNOSIS — Z8509 Personal history of malignant neoplasm of other digestive organs: Secondary | ICD-10-CM | POA: Insufficient documentation

## 2015-02-20 DIAGNOSIS — R5383 Other fatigue: Secondary | ICD-10-CM | POA: Insufficient documentation

## 2015-02-20 DIAGNOSIS — Z923 Personal history of irradiation: Secondary | ICD-10-CM | POA: Diagnosis not present

## 2015-02-20 DIAGNOSIS — R413 Other amnesia: Secondary | ICD-10-CM | POA: Insufficient documentation

## 2015-02-20 DIAGNOSIS — R978 Other abnormal tumor markers: Secondary | ICD-10-CM | POA: Diagnosis not present

## 2015-02-20 DIAGNOSIS — L658 Other specified nonscarring hair loss: Secondary | ICD-10-CM

## 2015-02-20 DIAGNOSIS — Z79899 Other long term (current) drug therapy: Secondary | ICD-10-CM | POA: Insufficient documentation

## 2015-02-20 DIAGNOSIS — C799 Secondary malignant neoplasm of unspecified site: Secondary | ICD-10-CM

## 2015-02-20 DIAGNOSIS — F418 Other specified anxiety disorders: Secondary | ICD-10-CM | POA: Diagnosis not present

## 2015-02-20 DIAGNOSIS — C7931 Secondary malignant neoplasm of brain: Secondary | ICD-10-CM | POA: Diagnosis not present

## 2015-02-20 DIAGNOSIS — C787 Secondary malignant neoplasm of liver and intrahepatic bile duct: Secondary | ICD-10-CM | POA: Insufficient documentation

## 2015-02-20 DIAGNOSIS — C23 Malignant neoplasm of gallbladder: Secondary | ICD-10-CM

## 2015-02-20 LAB — COMPREHENSIVE METABOLIC PANEL
ALBUMIN: 4.3 g/dL (ref 3.5–5.0)
ALK PHOS: 59 U/L (ref 38–126)
ALT: 20 U/L (ref 14–54)
ANION GAP: 3 — AB (ref 5–15)
AST: 28 U/L (ref 15–41)
BILIRUBIN TOTAL: 0.8 mg/dL (ref 0.3–1.2)
BUN: 15 mg/dL (ref 6–20)
CALCIUM: 8.7 mg/dL — AB (ref 8.9–10.3)
CO2: 28 mmol/L (ref 22–32)
CREATININE: 0.79 mg/dL (ref 0.44–1.00)
Chloride: 104 mmol/L (ref 101–111)
GFR calc Af Amer: >60 mL/min (ref >60)
GFR calc non Af Amer: >60 mL/min (ref >60)
GLUCOSE: 101 mg/dL — AB (ref 65–99)
Potassium: 4.3 mmol/L (ref 3.5–5.1)
SODIUM: 135 mmol/L (ref 135–145)
TOTAL PROTEIN: 7.3 g/dL (ref 6.5–8.1)

## 2015-02-20 LAB — CBC WITH DIFFERENTIAL/PLATELET
Basophils Absolute: 0 10*3/uL (ref 0–0.1)
Basophils Relative: 1 %
Eosinophils Absolute: 0.1 10*3/uL (ref 0–0.7)
Eosinophils Relative: 2 %
HEMATOCRIT: 42.1 % (ref 35.0–47.0)
HEMOGLOBIN: 14 g/dL (ref 12.0–16.0)
LYMPHS ABS: 1 10*3/uL (ref 1.0–3.6)
Lymphocytes Relative: 22 %
MCH: 28.9 pg (ref 26.0–34.0)
MCHC: 33.3 g/dL (ref 32.0–36.0)
MCV: 86.8 fL (ref 80.0–100.0)
MONOS PCT: 11 %
Monocytes Absolute: 0.5 10*3/uL (ref 0.2–0.9)
NEUTROS ABS: 3.1 10*3/uL (ref 1.4–6.5)
NEUTROS PCT: 64 %
Platelets: 179 10*3/uL (ref 150–440)
RBC: 4.85 MIL/uL (ref 3.80–5.20)
RDW: 14.4 % (ref 11.5–14.5)
WBC: 4.7 10*3/uL (ref 3.6–11.0)

## 2015-02-20 NOTE — Progress Notes (Signed)
Marcia Collins @ [facility] Telephone:(336) [phone]  Fax:(336) [place]: 1945-10-20  MR#: [id number]  ZPH#:[id number]  Patient Care Team: Juline Patch, MD as PCP - General (Family Medicine) Forest Gleason, MD (Unknown Physician Specialty) Christene Lye, MD (General Surgery)  CHIEF COMPLAINT:  Chief Complaint  Patient presents with  . Results    Oncology History   1. History of gallbladder carcinoma.Diagnosis in February of 2008 resection of poor to lymph node, resection of liver week for and findings segment and partial duodenal resection done at [facility] Final staging was T1, N0, M0 stage I gallbladder carcinoma. no followup adjuvant chemotherapy or radiation therapy. 2. Has abnormal MRI scan of the liver consistent with multiple metastases (September, 2014) 3. 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  Had resection of the right abdominal wall nodule.  (October, 2015) 8.  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  Biopsy was done at [facility].  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  Patient is here for further follow-up and treatment consideration.  Patient is off steroids now.  Appetite is improving.  No nausea.  No vomiting.  No diarrhea.  Continues to some problems with  memory. INTERVAL HISTORY: \ Extremity 16-year-old lady came today further follow-up.  As described in history and physical patient is gradually improving from radiation therapy to the brain.  Family is here and had number of questions.   January 10, 2015 Marcia Collins came today further follow-up.  Patient had a radiation therapy and a repeat MRI scan.  Family complains about loss of memory.  Patient and family here to discuss the results of the MRI scan.  Patient denies any loss of appetite or weight. No abdominal pain.  No nausea no vomiting.  Alopecia persist.  Patient and family here to discuss the results of the PET scanning.  They are also concerned with slightly elevated tumor markers. Patient remains asymptomatic no headache no dizziness REVIEW OF SYSTEMS:    general status: Patient is feeling weak and tired.  No change in a performance status.  No chills.  No fever. HEENT: Alopecia.  No evidence of stomatitis Lungs: No cough or shortness of breath Cardiac: No chest pain or paroxysmal nocturnal dyspnea GI: No nausea no vomiting no diarrhea no abdominal pain Skin: No rash Lower extremity no swelling Neurological system: No tingling.  No numbness.  No other focal signs Musculoskeletal system no bony pains  As per HPI. Otherwise, a complete review of systems is negatve.  PAST MEDICAL HISTORY: Past Medical History  Diagnosis Date  . Cancer [facility]) 2007    gallbladder  . Metastasis from gallbladder cancer ([facility]) 11/05/2014    PAST SURGICAL HISTORY: Past Surgical History  Procedure Laterality Date  . Cholecystectomy  03/2006  . Hand surgery Right 2012  . Portacath placement  01/2013  .  Colonoscopy  2013  . Mass excision  02-01-14    abdominal wall    FAMILY HISTORY Family History  Problem Relation Age of Onset  . Other Father     brain tumor    ADVANCED DIRECTIVES:  Patient does not have any living will or healthcare power of attorney.  Information was given .  Available  resources had been discussed.  We will follow-up on subsequent appointments regarding this issue HEALTH MAINTENANCE: Social History  Substance Use Topics  . Smoking status: Never Smoker   . Smokeless tobacco: Never Used  . Alcohol Use: No      No Known Allergies  Current Outpatient Prescriptions  Medication Sig Dispense Refill  . citalopram (CELEXA) 20 MG tablet Take 1 tablet (20 mg total) by mouth daily. 30 tablet 3  . lisinopril (PRINIVIL,ZESTRIL) 20 MG tablet Take 1 tablet (20 mg total) by mouth daily. 30 tablet 3  . traMADol (ULTRAM) 50 MG tablet Take 1 tablet (50 mg total) by mouth daily. 30 tablet 3  . ALPRAZolam (XANAX) 0.25 MG tablet      No current facility-administered medications for this visit.   Facility-Administered Medications Ordered in Other Visits  Medication Dose Route Frequency Provider Last Rate Last Dose  . fludeoxyglucose F - 18 (FDG) injection 11.95 milli Curie  11.95 milli Curie Intravenous Once PRN Forest Gleason, MD   11.95 milli Curie at 02/16/15 [phone]    OBJECTIVE:     Body mass index is 28.52 kg/(m^2).    ECOG FS:1 - Symptomatic but completely ambulatory  PHYSICAL EXAM: GENERAL:  Well developed, well nourished, sitting comfortably in the exam room in no acute distress. MENTAL STATUS:  Alert and oriented to person, place and time. HEAD:  alopecia.  Normocephalic, atraumatic, face symmetric, no Cushingoid features. 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  Pupils equal round and reactive to light and accomodation.  No conjunctivitis or scleral icterus. ENT:  Oropharynx clear without lesion.  Tongue normal. Mucous membranes moist.  RESPIRATORY:  Clear to auscultation without rales, wheezes or rhonchi. CARDIOVASCULAR:  Regular rate and rhythm without murmur, rub or gallop.  ABDOMEN:  Soft, non-tender, with active bowel sounds, and no hepatosplenomegaly.  No masses. No palpable masses BACK:  No CVA tenderness.  No tenderness on percussion  of the back or rib cage. SKIN:  No rashes, ulcers or lesions. EXTREMITIES: No edema, no skin discoloration or tenderness.  No palpable cords. LYMPH NODES: No palpable cervical, supraclavicular, axillary or inguinal adenopathy  NEUROLOGICAL: Unremarkable. PSYCH:  He should not is also extremely anxious and depressed so Celexa has been started   LAB RESULTS:  No visits with results within 2 Day(s) from this visit. Latest known visit with results is:  Hospital Outpatient Visit on 02/16/2015  Component Date Value Ref Range Status  . Glucose-Capillary 02/16/2015 91  65 - 99 mg/dL Final  . WBC 02/20/2015 4.7  3.6 - 11.0 K/uL Final  . RBC 02/20/2015 4.85  3.80 - 5.20 MIL/uL Final  . Hemoglobin 02/20/2015 14.0  12.0 - 16.0 g/dL Final  . HCT 02/20/2015 42.1  35.0 - 47.0 % Final  . MCV 02/20/2015 86.8  80.0 - 100.0 fL Final  . MCH 02/20/2015 28.9  26.0 - 34.0 pg Final  . MCHC 02/20/2015 33.3  32.0 - 36.0 g/dL Final  . RDW 02/20/2015 14.4  11.5 - 14.5 % Final  . Platelets 02/20/2015 179  150 - 440 K/uL Final  . Neutrophils Relative % 02/20/2015  64   Final  . Neutro Abs 02/20/2015 3.1  1.4 - 6.5 K/uL Final  . Lymphocytes Relative 02/20/2015 22   Final  . Lymphs Abs 02/20/2015 1.0  1.0 - 3.6 K/uL Final  . Monocytes Relative 02/20/2015 11   Final  . Monocytes Absolute 02/20/2015 0.5  0.2 - 0.9 K/uL Final  . Eosinophils Relative 02/20/2015 2   Final  . Eosinophils Absolute 02/20/2015 0.1  0 - 0.7 K/uL Final  . Basophils Relative 02/20/2015 1   Final  . Basophils Absolute 02/20/2015 0.0  0 - 0.1 K/uL Final  . Sodium 02/20/2015 135  135 - 145 mmol/L Final  . Potassium 02/20/2015 4.3  3.5 - 5.1 mmol/L Final  . Chloride 02/20/2015 104  101 - 111 mmol/L Final  . CO2 02/20/2015 28  22 - 32 mmol/L Final  . Glucose, Bld 02/20/2015 101* 65 - 99 mg/dL Final  . BUN 02/20/2015 15  6 - 20 mg/dL Final  . Creatinine, Ser 02/20/2015 0.79  0.44 - 1.00 mg/dL Final  . Calcium 02/20/2015 8.7* 8.9 - 10.3 mg/dL  Final  . Total Protein 02/20/2015 7.3  6.5 - 8.1 g/dL Final  . Albumin 02/20/2015 4.3  3.5 - 5.0 g/dL Final  . AST 02/20/2015 28  15 - 41 U/L Final  . ALT 02/20/2015 20  14 - 54 U/L Final  . Alkaline Phosphatase 02/20/2015 59  38 - 126 U/L Final  . Total Bilirubin 02/20/2015 0.8  0.3 - 1.2 mg/dL Final  . GFR calc non Af Amer 02/20/2015 >60  >60 mL/min Final  . GFR calc Af Amer 02/20/2015 >60  >60 mL/min Final   Comment: (NOTE) The eGFR has been calculated using the CKD EPI equation. This calculation has not been validated in all clinical situations. eGFR's persistently <60 mL/min signify possible Chronic Kidney Disease.   . Anion gap 02/20/2015 3* 5 - 15 Final   CA 19-9 is 80 (previously a month ago was 176)  ASSESSMENT: Stage IV carcinoma of the gallbladder status post abdominal wall mass excision no metastases to the brain biopsies positive of that of brain lesion consistent with adenocarcinoma status post radiation therapy Depression and anxiety.   Patient has been taken off Xanax Continue Celexa Bilateral screening mammogram PET scan for complete restaging as tumor markers are rising All other lab data has been reviewed./ 2.  Memory loss may be delayed effect of radiation therapy will continue to monitor MEDICAL DECISION MAKING:  PET scan has been reviewed independently.  There is small persistent abdominal wall nodULE  which has not changed. Nodule is not palpable on clinical examination  Continue follow-up without any intervention Family was there to discuss the results and further planning of treatment  Patient expressed understanding and was in agreement with this plan. She also understands that She can call clinic at any time with any questions, concerns, or complaints.    No matching staging information was found for the patient.  Forest Gleason, MD   02/20/2015 12:15 PM

## 2015-02-20 NOTE — Progress Notes (Signed)
Patient here for results. 

## 2015-02-21 LAB — CANCER ANTIGEN 19-9: CA 19-9: 80 U/mL — ABNORMAL HIGH (ref 0–35)

## 2015-02-25 ENCOUNTER — Encounter: Payer: Self-pay | Admitting: Oncology

## 2015-02-28 ENCOUNTER — Ambulatory Visit
Admission: RE | Admit: 2015-02-28 | Discharge: 2015-02-28 | Disposition: A | Discharge status: Home / Self Care | Payer: Medicare Other | Source: Ambulatory Visit | Attending: Oncology | Admitting: Oncology

## 2015-02-28 DIAGNOSIS — Z1231 Encounter for screening mammogram for malignant neoplasm of breast: Secondary | ICD-10-CM | POA: Insufficient documentation

## 2015-03-01 ENCOUNTER — Other Ambulatory Visit: Payer: Self-pay | Admitting: Oncology

## 2015-03-03 ENCOUNTER — Other Ambulatory Visit: Payer: Self-pay | Admitting: Oncology

## 2015-03-21 ENCOUNTER — Inpatient Hospital Stay: Payer: Medicare Other

## 2015-03-21 ENCOUNTER — Inpatient Hospital Stay: Payer: Medicare Other | Attending: Oncology

## 2015-03-21 VITALS — BP 128/64 | HR 79 | Temp 97.9°F | Resp 20

## 2015-03-21 DIAGNOSIS — Z452 Encounter for adjustment and management of vascular access device: Secondary | ICD-10-CM | POA: Insufficient documentation

## 2015-03-21 DIAGNOSIS — C23 Malignant neoplasm of gallbladder: Secondary | ICD-10-CM | POA: Diagnosis present

## 2015-03-21 DIAGNOSIS — Z9221 Personal history of antineoplastic chemotherapy: Secondary | ICD-10-CM | POA: Diagnosis not present

## 2015-03-21 DIAGNOSIS — C7931 Secondary malignant neoplasm of brain: Secondary | ICD-10-CM | POA: Insufficient documentation

## 2015-03-21 DIAGNOSIS — Z95828 Presence of other vascular implants and grafts: Secondary | ICD-10-CM

## 2015-03-21 DIAGNOSIS — Z923 Personal history of irradiation: Secondary | ICD-10-CM | POA: Diagnosis not present

## 2015-03-21 DIAGNOSIS — C787 Secondary malignant neoplasm of liver and intrahepatic bile duct: Secondary | ICD-10-CM | POA: Diagnosis not present

## 2015-03-21 MED ORDER — SODIUM CHLORIDE 0.9 % IJ SOLN
10.0000 mL | INTRAMUSCULAR | Status: DC | PRN
  Administered 2015-03-21: 10 mL via INTRAVENOUS
  Filled 2015-03-21: qty 10

## 2015-03-21 MED ORDER — HEPARIN SOD (PORK) LOCK FLUSH 100 UNIT/ML IV SOLN
500.0000 [IU] | Freq: Once | INTRAVENOUS | Status: AC
  Administered 2015-03-21: 500 [IU] via INTRAVENOUS

## 2015-03-29 ENCOUNTER — Telehealth: Payer: Self-pay | Admitting: *Deleted

## 2015-03-29 MED ORDER — TRAMADOL HCL 50 MG PO TABS: 50.0000 mg | ORAL_TABLET | Freq: Every day | ORAL | Status: AC

## 2015-03-29 NOTE — Telephone Encounter (Signed)
Faxed Mr Boettner notified

## 2015-04-04 ENCOUNTER — Inpatient Hospital Stay: Payer: Medicare Other

## 2015-04-04 ENCOUNTER — Encounter: Payer: Self-pay | Admitting: Oncology

## 2015-04-04 ENCOUNTER — Inpatient Hospital Stay: Payer: Medicare Other | Attending: Oncology | Admitting: Oncology

## 2015-04-04 ENCOUNTER — Telehealth: Payer: Self-pay | Admitting: *Deleted

## 2015-04-04 VITALS — BP 116/87 | HR 102 | Temp 98.6°F | Wt 181.1 lb

## 2015-04-04 DIAGNOSIS — Z9181 History of falling: Secondary | ICD-10-CM | POA: Insufficient documentation

## 2015-04-04 DIAGNOSIS — F418 Other specified anxiety disorders: Secondary | ICD-10-CM | POA: Diagnosis not present

## 2015-04-04 DIAGNOSIS — R1011 Right upper quadrant pain: Secondary | ICD-10-CM

## 2015-04-04 DIAGNOSIS — R410 Disorientation, unspecified: Secondary | ICD-10-CM | POA: Insufficient documentation

## 2015-04-04 DIAGNOSIS — R51 Headache: Secondary | ICD-10-CM | POA: Diagnosis not present

## 2015-04-04 DIAGNOSIS — Z923 Personal history of irradiation: Secondary | ICD-10-CM | POA: Diagnosis not present

## 2015-04-04 DIAGNOSIS — C7931 Secondary malignant neoplasm of brain: Secondary | ICD-10-CM | POA: Insufficient documentation

## 2015-04-04 DIAGNOSIS — C23 Malignant neoplasm of gallbladder: Secondary | ICD-10-CM

## 2015-04-04 DIAGNOSIS — Z9221 Personal history of antineoplastic chemotherapy: Secondary | ICD-10-CM | POA: Insufficient documentation

## 2015-04-04 DIAGNOSIS — R630 Anorexia: Secondary | ICD-10-CM | POA: Diagnosis not present

## 2015-04-04 DIAGNOSIS — W19XXXA Unspecified fall, initial encounter: Secondary | ICD-10-CM

## 2015-04-04 DIAGNOSIS — Z79899 Other long term (current) drug therapy: Secondary | ICD-10-CM | POA: Insufficient documentation

## 2015-04-04 DIAGNOSIS — R634 Abnormal weight loss: Secondary | ICD-10-CM | POA: Diagnosis not present

## 2015-04-04 DIAGNOSIS — R5383 Other fatigue: Secondary | ICD-10-CM | POA: Diagnosis not present

## 2015-04-04 DIAGNOSIS — Z9049 Acquired absence of other specified parts of digestive tract: Secondary | ICD-10-CM | POA: Diagnosis not present

## 2015-04-04 DIAGNOSIS — R748 Abnormal levels of other serum enzymes: Secondary | ICD-10-CM | POA: Insufficient documentation

## 2015-04-04 DIAGNOSIS — C787 Secondary malignant neoplasm of liver and intrahepatic bile duct: Secondary | ICD-10-CM | POA: Diagnosis not present

## 2015-04-04 DIAGNOSIS — R2681 Unsteadiness on feet: Secondary | ICD-10-CM

## 2015-04-04 DIAGNOSIS — F4489 Other dissociative and conversion disorders: Secondary | ICD-10-CM

## 2015-04-04 DIAGNOSIS — R531 Weakness: Secondary | ICD-10-CM | POA: Insufficient documentation

## 2015-04-04 DIAGNOSIS — E871 Hypo-osmolality and hyponatremia: Secondary | ICD-10-CM | POA: Diagnosis not present

## 2015-04-04 LAB — COMPREHENSIVE METABOLIC PANEL
ALBUMIN: 2.7 g/dL — AB (ref 3.5–5.0)
ALK PHOS: 162 U/L — AB (ref 38–126)
ALT: 29 U/L (ref 14–54)
ANION GAP: 9 (ref 5–15)
AST: 34 U/L (ref 15–41)
BILIRUBIN TOTAL: 4 mg/dL — AB (ref 0.3–1.2)
BUN: 24 mg/dL — AB (ref 6–20)
CALCIUM: 8.5 mg/dL — AB (ref 8.9–10.3)
CO2: 25 mmol/L (ref 22–32)
CREATININE: 0.83 mg/dL (ref 0.44–1.00)
Chloride: 92 mmol/L — ABNORMAL LOW (ref 101–111)
GFR calc Af Amer: >60 mL/min (ref >60)
GFR calc non Af Amer: >60 mL/min (ref >60)
GLUCOSE: 113 mg/dL — AB (ref 65–99)
Potassium: 3.7 mmol/L (ref 3.5–5.1)
Sodium: 126 mmol/L — ABNORMAL LOW (ref 135–145)
TOTAL PROTEIN: 6.4 g/dL — AB (ref 6.5–8.1)

## 2015-04-04 LAB — CBC WITH DIFFERENTIAL/PLATELET
Band Neutrophils: 11 %
Basophils Absolute: 0 10*3/uL (ref 0–0.1)
Basophils Relative: 0 %
EOS PCT: 1 %
Eosinophils Absolute: 0.1 10*3/uL (ref 0–0.7)
HEMATOCRIT: 32.7 % — AB (ref 35.0–47.0)
Hemoglobin: 11 g/dL — ABNORMAL LOW (ref 12.0–16.0)
Lymphocytes Relative: 3 %
Lymphs Abs: 0.3 10*3/uL — ABNORMAL LOW (ref 1.0–3.6)
MCH: 28.9 pg (ref 26.0–34.0)
MCHC: 33.7 g/dL (ref 32.0–36.0)
MCV: 85.7 fL (ref 80.0–100.0)
MONOS PCT: 1 %
Monocytes Absolute: 0.1 10*3/uL — ABNORMAL LOW (ref 0.2–0.9)
NEUTROS ABS: 7.8 10*3/uL — AB (ref 1.4–6.5)
Neutrophils Relative %: 84 %
Platelets: 87 10*3/uL — ABNORMAL LOW (ref 150–440)
RBC: 3.82 MIL/uL (ref 3.80–5.20)
RDW: 16.4 % — AB (ref 11.5–14.5)
WBC: 8.3 10*3/uL (ref 3.6–11.0)

## 2015-04-04 MED ORDER — PREDNISONE 20 MG PO TABS: 20.0000 mg | ORAL_TABLET | Freq: Every day | ORAL | Status: AC

## 2015-04-04 NOTE — Telephone Encounter (Signed)
Per Dr Jeb Levering, see her this afternoon with CBC, CMP. Jonni Sanger will have her here at 200 for lab 215 for md

## 2015-04-04 NOTE — Progress Notes (Signed)
[facility] [phone]  [phone]     Marcia Collins OB: 10/08/1945  MR#: [id number]  CSN#:[id number]  Patient Care Team: Deanna C Jones, MD as PCP - General (Family Medicine) Janak Choksi, MD (Unknown Physician Specialty) Seeplaputhur G Sankar, MD (General Surgery)  CHIEF COMPLAINT:  Chief Complaint  Patient presents with  . Acute Visit    Oncology History   1. History of gallbladder carcinoma.Diagnosis in February of 2008 resection of poor to lymph node, resection of liver week for and findings segment and partial duodenal resection done at [facility] Final staging was T1, N0, M0 stage I gallbladder carcinoma. no followup adjuvant chemotherapy or radiation therapy. 2. Has abnormal MRI scan of the liver consistent with multiple metastases (September, 2014) 3. 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  Had resection of the right abdominal wall nodule.  (October, 2015) 8.  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  Biopsy was done at [facility].  Patient received radiation therapy (May of 2016)     Carcinoma gallbladder (HCC)    Radiation Therapy   NTERVAL HISTORY: \  69-year-old lady was brought in today as an acute add-on.  He received a phone call that patient was having increasing pain in the right flank over 2-3 weeks duration. Pain is in the right flank constant dull aching.  Family has noticed that she has been getting more forgetful and somewhat confused in last few weeks. Patient reports  intermittent headache. Patient's ambulation has decreased. Appetite is poor and has lost weight Last night patient was trying to sit down on the chair and missed the chair and fell. No seizure activity reported  Patient was brought in today in wheelchair my son who reports gradual decline over.  Of last few weeks Review of systems  general status: Patient is feeling weak and tired.  No change in a performance status.  No chills.  No fever. HEENT:   No evidence of stomatitis Lungs: No cough or shortness of breath Cardiac: No chest pain or paroxysmal nocturnal dyspnea GI: Right flank pain is reported in history of present illness Skin: No rash Lower extremity no swelling Neurological system: Progressive forgetfulness and difficulty in ambulation is reported in interval history Musculoskeletal system no bony pains Progressive decline in performance status As per HPI. Otherwise, a complete review of systems is negatve.  PAST MEDICAL HISTORY: Past Medical History  Diagnosis Date  . Cancer (HCC) 2007    gallbladder  . Metastasis from gallbladder cancer (HCC) 11/05/2014    PAST SURGICAL HISTORY: Past Surgical History  Procedure Laterality Date  . Cholecystectomy  03/2006  . Hand surgery Right 2012  . Portacath placement  01/2013  . Colonoscopy  2013  . Mass excision  02-01-14    abdominal wall    FAMILY HISTORY Family History  Problem Relation Age of Onset  . Other Father     brain tumor    ADVANCED DIRECTIVES:  Patient does not have any living will or healthcare power of attorney.  Information was given .  Available resources had been discussed.    We will follow-up on subsequent appointments regarding this issue HEALTH MAINTENANCE: Social History  Substance Use Topics  . Smoking status: Never Smoker   . Smokeless tobacco: Never Used  . Alcohol Use: No      No Known Allergies  Current Outpatient Prescriptions  Medication Sig Dispense Refill  . citalopram (CELEXA) 20  MG tablet Take 1 tablet (20 mg total) by mouth daily. 30 tablet 3  . lisinopril (PRINIVIL,ZESTRIL) 20 MG tablet Take 1 tablet (20 mg total) by mouth daily. 30 tablet 3  . traMADol (ULTRAM) 50 MG tablet Take 1 tablet (50 mg total) by mouth daily. 30 tablet 1   No current facility-administered medications for this visit.    OBJECTIVE:     Body mass index is 29.24 kg/(m^2).    ECOG FS:1 - Symptomatic but completely ambulatory  PHYSICAL EXAM: GENERAL:  Well developed, well nourished, sitting comfortably in the exam room in no acute distress. MENTAL STATUS:  Alert and oriented to person, place and time. HEAD:    Normocephalic, atraumatic, face symmetric, no Cushingoid features.  EYES:  .  Pupils equal round and reactive to light and accomodation.  No conjunctivitis or scleral icterus. ENT:  Oropharynx clear without lesion.  Tongue normal. Mucous membranes moist.  RESPIRATORY:  Clear to auscultation without rales, wheezes or rhonchi. CARDIOVASCULAR:  Regular rate and rhythm without murmur, rub or gallop.  ABDOMEN:  Soft, non-tender, with active bowel sounds, and no hepatosplenomegaly.  No masses. No palpable masses. Tenderness in right upper quadrant  BACK:  No CVA tenderness.  No tenderness on percussion of the back or rib cage. SKIN:  No rashes, ulcers or lesions. EXTREMITIES: No edema, no skin discoloration or tenderness.  No palpable cords. LYMPH NODES: No palpable cervical, supraclavicular, axillary or inguinal adenopathy  NEUROLOGICAL: Patient has mild dementia.  Cerebellar ataxia.  No other focal signs PSYCH:  He should not is also extremely anxious and depressed so Celexa has been started   LAB RESULTS:  Appointment on 04/04/2015  Component Date Value Ref Range Status  . WBC 04/04/2015 8.3  3.6 - 11.0 K/uL Final  . RBC 04/04/2015 3.82  3.80 - 5.20 MIL/uL Final  . Hemoglobin 04/04/2015 11.0* 12.0 - 16.0 g/dL Final  . HCT 04/04/2015 32.7* 35.0 - 47.0 % Final  . MCV 04/04/2015  85.7  80.0 - 100.0 fL Final  . MCH 04/04/2015 28.9  26.0 - 34.0 pg Final  . MCHC 04/04/2015 33.7  32.0 - 36.0 g/dL Final  . RDW 04/04/2015 16.4* 11.5 - 14.5 % Final  . Platelets 04/04/2015 87* 150 - 440 K/uL Final  . Neutrophils Relative % 04/04/2015 PENDING   Incomplete  . Neutro Abs 04/04/2015 PENDING  1.7 - 7.7 K/uL Incomplete  . Band Neutrophils 04/04/2015 PENDING   Incomplete  . Lymphocytes Relative 04/04/2015 PENDING   Incomplete  . Lymphs Abs 04/04/2015 PENDING  0.7 - 4.0 K/uL Incomplete  . Monocytes Relative 04/04/2015 PENDING   Incomplete  . Monocytes Absolute 04/04/2015 PENDING  0.1 - 1.0 K/uL Incomplete  . Eosinophils Relative 04/04/2015 PENDING   Incomplete  . Eosinophils Absolute 04/04/2015 PENDING  0.0 - 0.7 K/uL Incomplete  . Basophils Relative 04/04/2015 PENDING   Incomplete  . Basophils Absolute 04/04/2015 PENDING  0.0 - 0.1 K/uL Incomplete  . WBC Morphology 04/04/2015 PENDING   Incomplete  . RBC Morphology 04/04/2015 PENDING   Incomplete  . Smear Review 04/04/2015 PENDING   Incomplete  . Other 04/04/2015 PENDING   Incomplete  .  nRBC 04/04/2015 PENDING  0 /100 WBC Incomplete  . Metamyelocytes Relative 04/04/2015 PENDING   Incomplete  . Myelocytes 04/04/2015 PENDING   Incomplete  . Promyelocytes Absolute 04/04/2015 PENDING   Incomplete  . Blasts 04/04/2015 PENDING   Incomplete  . Sodium 04/04/2015 126* 135 - 145 mmol/L Final  . Potassium 04/04/2015 3.7  3.5 - 5.1 mmol/L Final  . Chloride 04/04/2015 92* 101 - 111 mmol/L Final  . CO2 04/04/2015 25  22 - 32 mmol/L Final  . Glucose, Bld 04/04/2015 113* 65 - 99 mg/dL Final  . BUN 04/04/2015 24* 6 - 20 mg/dL Final  . Creatinine, Ser 04/04/2015 0.83  0.44 - 1.00 mg/dL Final  . Calcium 04/04/2015 8.5* 8.9 - 10.3 mg/dL Final  . Total Protein 04/04/2015 6.4* 6.5 - 8.1 g/dL Final  . Albumin 04/04/2015 2.7* 3.5 - 5.0 g/dL Final  . AST 04/04/2015 34  15 - 41 U/L Final  . ALT 04/04/2015 29  14 - 54 U/L Final  . Alkaline  Phosphatase 04/04/2015 162* 38 - 126 U/L Final  . Total Bilirubin 04/04/2015 4.0* 0.3 - 1.2 mg/dL Final  . GFR calc non Af Amer 04/04/2015 >60  >60 mL/min Final  . GFR calc Af Amer 04/04/2015 >60  >60 mL/min Final   Comment: (NOTE) The eGFR has been calculated using the CKD EPI equation. This calculation has not been validated in all clinical situations. eGFR's persistently <60 mL/min signify possible Chronic Kidney Disease.   . Anion gap 04/04/2015 9  5 - 15 Final   CA 19-9 is 80 (previously a month ago was 176)  ASSESSMENT: Stage IV carcinoma of the gallbladder status post abdominal wall mass excision no metastases to the brain biopsies positive of that of brain lesion consistent with adenocarcinoma status post radiation therapy Depression and anxiety.     MEDICAL DECISION MAKING:  Patient was brought in as an acute and on  Right upper quadrant pain particularly flank pain  Liver enzymes are slightly abnormal with rising alkaline phosphatase  In view of patient's history of cholangiocarcinoma will repeat CT scan of abdomen to rule out any progressing or metastatic disease  Patient is forgetful and confused  Sodium is 126  Cause of hyponatremia is not known patient is on lisinopril  It could be SIADH  Considering neurological symptoms and previous history of brain metastases MRI scan of head would be done  Patient will be started on a small dose of prednisone with 20 mg daily  May need IV fluid with sodium with normal saline tomorrow  Discussed that with patient and family.  Then they understood  Total duration of visit was 45 minutes.  50% or more time was spent in counseling patient and family regarding prognosis  Need for control of pain patient has a hydrocodone at home  with patient and monitor her Patient may not be left alone. Reevaluate patient after all the x-rays are available  Patient expressed understanding and was in agreement with this plan. She also understands  that She can call clinic at any time with any questions, concerns, or complaints.    No matching staging information was found for the patient.  Forest Gleason, MD   04/04/2015 2:55 PM

## 2015-04-04 NOTE — Progress Notes (Signed)
Patient acute add on today.  States she fell last night and 4 times in past 3 days.  Patient states her legs give out. Falls are happening mostly at night.  Son with patient today.  He states she breaths very shallow.

## 2015-04-04 NOTE — Telephone Encounter (Signed)
Called asking for patient to be evaluated for RUQ pain, she is about in tears with pain at her GB surg site. She is taking the Tramadol and it does help. She has fallen 4 times in past few days. SHe has a shuffling gait. Reports that her breathing is very shallow.

## 2015-04-05 ENCOUNTER — Inpatient Hospital Stay: Payer: Medicare Other

## 2015-04-05 DIAGNOSIS — C23 Malignant neoplasm of gallbladder: Secondary | ICD-10-CM

## 2015-04-05 MED ORDER — HEPARIN SOD (PORK) LOCK FLUSH 100 UNIT/ML IV SOLN
INTRAVENOUS | Status: AC
  Filled 2015-04-05: qty 5

## 2015-04-05 MED ORDER — SODIUM CHLORIDE 0.9 % IV SOLN
Freq: Once | INTRAVENOUS | Status: AC
  Administered 2015-04-05: 15:00:00 via INTRAVENOUS
  Filled 2015-04-05: qty 1000

## 2015-04-05 MED ORDER — HEPARIN SOD (PORK) LOCK FLUSH 100 UNIT/ML IV SOLN
500.0000 [IU] | Freq: Once | INTRAVENOUS | Status: AC | PRN
  Administered 2015-04-05: 500 [IU]

## 2015-04-10 ENCOUNTER — Ambulatory Visit: Payer: Medicare Other | Admitting: Oncology

## 2015-04-10 ENCOUNTER — Other Ambulatory Visit: Payer: Medicare Other

## 2015-04-11 ENCOUNTER — Telehealth: Payer: Self-pay | Admitting: *Deleted

## 2015-04-11 NOTE — Telephone Encounter (Signed)
Her mental status has gotten worse, she is incontinent, does not seem to know where she is, stares off into space. Unable to stay by self, and too much care for her elderly husband. Asking about help with her care. I explained to him about getting MRI as scheduled tomorrow and discussing with MD Thursday as scheduled for results. I discussed with him options for hiring someone to come to home vs checking on facilities and discussed places in [place]. I also contacted [facility] who will talk with them on Thursday.

## 2015-04-12 ENCOUNTER — Telehealth: Payer: Self-pay | Admitting: *Deleted

## 2015-04-12 ENCOUNTER — Inpatient Hospital Stay: Payer: Medicare Other

## 2015-04-12 ENCOUNTER — Inpatient Hospital Stay: Payer: Medicare Other | Admitting: Oncology

## 2015-04-12 ENCOUNTER — Inpatient Hospital Stay
Admission: AD | Admit: 2015-04-12 | Discharge: 2015-04-30 | DRG: 871 | Disposition: E | Payer: Medicare Other | Source: Ambulatory Visit | Attending: Oncology | Admitting: Oncology

## 2015-04-12 ENCOUNTER — Encounter: Payer: Self-pay | Admitting: *Deleted

## 2015-04-12 ENCOUNTER — Other Ambulatory Visit: Payer: Self-pay | Admitting: Oncology

## 2015-04-12 ENCOUNTER — Encounter: Payer: Self-pay | Admitting: Oncology

## 2015-04-12 VITALS — BP 121/70 | HR 93 | Temp 94.9°F

## 2015-04-12 DIAGNOSIS — B961 Klebsiella pneumoniae [K. pneumoniae] as the cause of diseases classified elsewhere: Secondary | ICD-10-CM | POA: Diagnosis present

## 2015-04-12 DIAGNOSIS — D496 Neoplasm of unspecified behavior of brain: Secondary | ICD-10-CM

## 2015-04-12 DIAGNOSIS — D649 Anemia, unspecified: Secondary | ICD-10-CM | POA: Diagnosis present

## 2015-04-12 DIAGNOSIS — Z8509 Personal history of malignant neoplasm of other digestive organs: Secondary | ICD-10-CM

## 2015-04-12 DIAGNOSIS — I82220 Acute embolism and thrombosis of inferior vena cava: Secondary | ICD-10-CM | POA: Diagnosis present

## 2015-04-12 DIAGNOSIS — Z66 Do not resuscitate: Secondary | ICD-10-CM | POA: Diagnosis present

## 2015-04-12 DIAGNOSIS — R0602 Shortness of breath: Secondary | ICD-10-CM

## 2015-04-12 DIAGNOSIS — C7931 Secondary malignant neoplasm of brain: Secondary | ICD-10-CM | POA: Diagnosis present

## 2015-04-12 DIAGNOSIS — J90 Pleural effusion, not elsewhere classified: Secondary | ICD-10-CM | POA: Diagnosis present

## 2015-04-12 DIAGNOSIS — L89301 Pressure ulcer of unspecified buttock, stage 1: Secondary | ICD-10-CM | POA: Diagnosis present

## 2015-04-12 DIAGNOSIS — C240 Malignant neoplasm of extrahepatic bile duct: Secondary | ICD-10-CM

## 2015-04-12 DIAGNOSIS — C799 Secondary malignant neoplasm of unspecified site: Secondary | ICD-10-CM

## 2015-04-12 DIAGNOSIS — R0902 Hypoxemia: Secondary | ICD-10-CM | POA: Diagnosis not present

## 2015-04-12 DIAGNOSIS — J9383 Other pneumothorax: Secondary | ICD-10-CM | POA: Diagnosis not present

## 2015-04-12 DIAGNOSIS — N179 Acute kidney failure, unspecified: Secondary | ICD-10-CM

## 2015-04-12 DIAGNOSIS — G9341 Metabolic encephalopathy: Secondary | ICD-10-CM | POA: Diagnosis present

## 2015-04-12 DIAGNOSIS — R1011 Right upper quadrant pain: Secondary | ICD-10-CM | POA: Diagnosis not present

## 2015-04-12 DIAGNOSIS — R627 Adult failure to thrive: Secondary | ICD-10-CM | POA: Diagnosis present

## 2015-04-12 DIAGNOSIS — C787 Secondary malignant neoplasm of liver and intrahepatic bile duct: Secondary | ICD-10-CM | POA: Diagnosis present

## 2015-04-12 DIAGNOSIS — Z79891 Long term (current) use of opiate analgesic: Secondary | ICD-10-CM

## 2015-04-12 DIAGNOSIS — Z79899 Other long term (current) drug therapy: Secondary | ICD-10-CM

## 2015-04-12 DIAGNOSIS — R451 Restlessness and agitation: Secondary | ICD-10-CM

## 2015-04-12 DIAGNOSIS — D6959 Other secondary thrombocytopenia: Secondary | ICD-10-CM | POA: Diagnosis present

## 2015-04-12 DIAGNOSIS — I959 Hypotension, unspecified: Secondary | ICD-10-CM | POA: Diagnosis not present

## 2015-04-12 DIAGNOSIS — Z515 Encounter for palliative care: Secondary | ICD-10-CM | POA: Diagnosis not present

## 2015-04-12 DIAGNOSIS — R630 Anorexia: Secondary | ICD-10-CM | POA: Diagnosis present

## 2015-04-12 DIAGNOSIS — A4151 Sepsis due to Escherichia coli [E. coli]: Secondary | ICD-10-CM | POA: Diagnosis present

## 2015-04-12 DIAGNOSIS — G629 Polyneuropathy, unspecified: Secondary | ICD-10-CM | POA: Diagnosis present

## 2015-04-12 DIAGNOSIS — J9601 Acute respiratory failure with hypoxia: Secondary | ICD-10-CM | POA: Diagnosis not present

## 2015-04-12 DIAGNOSIS — F4489 Other dissociative and conversion disorders: Secondary | ICD-10-CM

## 2015-04-12 DIAGNOSIS — Z9181 History of falling: Secondary | ICD-10-CM | POA: Diagnosis not present

## 2015-04-12 DIAGNOSIS — E860 Dehydration: Secondary | ICD-10-CM | POA: Diagnosis present

## 2015-04-12 DIAGNOSIS — N390 Urinary tract infection, site not specified: Secondary | ICD-10-CM | POA: Diagnosis present

## 2015-04-12 DIAGNOSIS — N170 Acute kidney failure with tubular necrosis: Secondary | ICD-10-CM | POA: Diagnosis present

## 2015-04-12 DIAGNOSIS — Z923 Personal history of irradiation: Secondary | ICD-10-CM

## 2015-04-12 DIAGNOSIS — C23 Malignant neoplasm of gallbladder: Secondary | ICD-10-CM

## 2015-04-12 DIAGNOSIS — Z9049 Acquired absence of other specified parts of digestive tract: Secondary | ICD-10-CM

## 2015-04-12 DIAGNOSIS — R6520 Severe sepsis without septic shock: Secondary | ICD-10-CM | POA: Diagnosis present

## 2015-04-12 DIAGNOSIS — Z9221 Personal history of antineoplastic chemotherapy: Secondary | ICD-10-CM

## 2015-04-12 DIAGNOSIS — R32 Unspecified urinary incontinence: Secondary | ICD-10-CM

## 2015-04-12 DIAGNOSIS — K219 Gastro-esophageal reflux disease without esophagitis: Secondary | ICD-10-CM | POA: Diagnosis present

## 2015-04-12 DIAGNOSIS — J939 Pneumothorax, unspecified: Secondary | ICD-10-CM

## 2015-04-12 DIAGNOSIS — C7951 Secondary malignant neoplasm of bone: Secondary | ICD-10-CM | POA: Diagnosis present

## 2015-04-12 DIAGNOSIS — A419 Sepsis, unspecified organism: Secondary | ICD-10-CM | POA: Diagnosis not present

## 2015-04-12 DIAGNOSIS — L8990 Pressure ulcer of unspecified site, unspecified stage: Secondary | ICD-10-CM | POA: Insufficient documentation

## 2015-04-12 DIAGNOSIS — Z7952 Long term (current) use of systemic steroids: Secondary | ICD-10-CM

## 2015-04-12 DIAGNOSIS — R109 Unspecified abdominal pain: Secondary | ICD-10-CM

## 2015-04-12 DIAGNOSIS — R4182 Altered mental status, unspecified: Secondary | ICD-10-CM

## 2015-04-12 DIAGNOSIS — Z1623 Resistance to quinolones and fluoroquinolones: Secondary | ICD-10-CM | POA: Diagnosis present

## 2015-04-12 DIAGNOSIS — G6289 Other specified polyneuropathies: Secondary | ICD-10-CM

## 2015-04-12 LAB — COMPREHENSIVE METABOLIC PANEL
ALBUMIN: 2.1 g/dL — AB (ref 3.5–5.0)
ALK PHOS: 131 U/L — AB (ref 38–126)
ALK PHOS: 121 U/L (ref 38–126)
ALT: 25 U/L (ref 14–54)
ALT: 23 U/L (ref 14–54)
ANION GAP: 12 (ref 5–15)
ANION GAP: 12 (ref 5–15)
AST: 37 U/L (ref 15–41)
AST: 28 U/L (ref 15–41)
Albumin: 1.9 g/dL — ABNORMAL LOW (ref 3.5–5.0)
BILIRUBIN TOTAL: 4.7 mg/dL — AB (ref 0.3–1.2)
BILIRUBIN TOTAL: 5.1 mg/dL — AB (ref 0.3–1.2)
BUN: 97 mg/dL — AB (ref 6–20)
BUN: 100 mg/dL — ABNORMAL HIGH (ref 6–20)
CALCIUM: 8.4 mg/dL — AB (ref 8.9–10.3)
CALCIUM: 8.2 mg/dL — AB (ref 8.9–10.3)
CO2: 23 mmol/L (ref 22–32)
CO2: 21 mmol/L — ABNORMAL LOW (ref 22–32)
Chloride: 93 mmol/L — ABNORMAL LOW (ref 101–111)
Chloride: 99 mmol/L — ABNORMAL LOW (ref 101–111)
Creatinine, Ser: 2.61 mg/dL — ABNORMAL HIGH (ref 0.44–1.00)
Creatinine, Ser: 2.67 mg/dL — ABNORMAL HIGH (ref 0.44–1.00)
GFR calc Af Amer: 20 mL/min — ABNORMAL LOW (ref >60)
GFR calc non Af Amer: 18 mL/min — ABNORMAL LOW (ref >60)
GFR calc non Af Amer: 17 mL/min — ABNORMAL LOW (ref >60)
GFR, EST AFRICAN AMERICAN: 20 mL/min — AB (ref >60)
GLUCOSE: 163 mg/dL — AB (ref 65–99)
Glucose, Bld: 145 mg/dL — ABNORMAL HIGH (ref 65–99)
POTASSIUM: 4.8 mmol/L (ref 3.5–5.1)
Potassium: 4.6 mmol/L (ref 3.5–5.1)
Sodium: 128 mmol/L — ABNORMAL LOW (ref 135–145)
Sodium: 132 mmol/L — ABNORMAL LOW (ref 135–145)
TOTAL PROTEIN: 7.5 g/dL (ref 6.5–8.1)
TOTAL PROTEIN: 7.1 g/dL (ref 6.5–8.1)

## 2015-04-12 LAB — URINALYSIS COMPLETE WITH MICROSCOPIC (ARMC ONLY)
GLUCOSE, UA: NEGATIVE mg/dL
Hgb urine dipstick: NEGATIVE
Ketones, ur: NEGATIVE mg/dL
NITRITE: NEGATIVE
PH: 5 (ref 5.0–8.0)
Protein, ur: NEGATIVE mg/dL
RBC / HPF: NONE SEEN RBC/hpf (ref 0–5)
Specific Gravity, Urine: 1.015 (ref 1.005–1.030)

## 2015-04-12 LAB — CBC WITH DIFFERENTIAL/PLATELET
BAND NEUTROPHILS: 14 %
BASOS PCT: 0 %
Basophils Absolute: 0 10*3/uL (ref 0–0.1)
EOS ABS: 0 10*3/uL (ref 0–0.7)
Eosinophils Relative: 0 %
HEMATOCRIT: 29.1 % — AB (ref 35.0–47.0)
HEMATOCRIT: 27.8 % — AB (ref 35.0–47.0)
HEMOGLOBIN: 9.7 g/dL — AB (ref 12.0–16.0)
HEMOGLOBIN: 9.3 g/dL — AB (ref 12.0–16.0)
LYMPHS ABS: 0.3 10*3/uL — AB (ref 1.0–3.6)
LYMPHS PCT: 2 %
LYMPHS PCT: 1 %
MCH: 27.8 pg (ref 26.0–34.0)
MCH: 28 pg (ref 26.0–34.0)
MCHC: 33.3 g/dL (ref 32.0–36.0)
MCHC: 33.4 g/dL (ref 32.0–36.0)
MCV: 83.6 fL (ref 80.0–100.0)
MCV: 83.8 fL (ref 80.0–100.0)
METAMYELOCYTES PCT: 1 %
MONOS PCT: 5 %
MONOS PCT: 6 %
Monocytes Absolute: 1.8 10*3/uL — ABNORMAL HIGH (ref 0.2–0.9)
Myelocytes: 1 %
NEUTROS ABS: 27.4 10*3/uL — AB (ref 1.4–6.5)
NEUTROS PCT: 77 %
Neutrophils Relative %: 93 %
Platelets: 86 10*3/uL — ABNORMAL LOW (ref 150–440)
Platelets: 72 10*3/uL — ABNORMAL LOW (ref 150–440)
RBC: 3.48 MIL/uL — ABNORMAL LOW (ref 3.80–5.20)
RBC: 3.32 MIL/uL — ABNORMAL LOW (ref 3.80–5.20)
RDW: 16.8 % — ABNORMAL HIGH (ref 11.5–14.5)
RDW: 16.7 % — AB (ref 11.5–14.5)
Smear Review: DECREASED
WBC: 37.6 10*3/uL — AB (ref 3.6–11.0)
WBC: 29.5 10*3/uL — ABNORMAL HIGH (ref 3.6–11.0)

## 2015-04-12 LAB — MAGNESIUM: MAGNESIUM: 2.5 mg/dL — AB (ref 1.7–2.4)

## 2015-04-12 MED ORDER — DEXAMETHASONE SODIUM PHOSPHATE 10 MG/ML IJ SOLN
INTRAMUSCULAR | Status: AC
  Filled 2015-04-12: qty 1

## 2015-04-12 MED ORDER — LORAZEPAM 2 MG/ML IJ SOLN
0.5000 mg | Freq: Two times a day (BID) | INTRAMUSCULAR | Status: DC | PRN
  Administered 2015-04-12: 0.5 mg via INTRAVENOUS
  Filled 2015-04-12: qty 1

## 2015-04-12 MED ORDER — SODIUM CHLORIDE 0.9 % IV SOLN: INTRAVENOUS | Status: DC

## 2015-04-12 MED ORDER — PANTOPRAZOLE SODIUM 40 MG IV SOLR
40.0000 mg | Freq: Every day | INTRAVENOUS | Status: DC
  Administered 2015-04-12 – 2015-04-14 (×3): 40 mg via INTRAVENOUS
  Filled 2015-04-12 (×3): qty 40

## 2015-04-12 MED ORDER — CIPROFLOXACIN IN D5W 200 MG/100ML IV SOLN
200.0000 mg | Freq: Two times a day (BID) | INTRAVENOUS | Status: DC
Start: 2015-04-12 — End: 2015-04-13
  Administered 2015-04-12 – 2015-04-13 (×2): 200 mg via INTRAVENOUS
  Filled 2015-04-12 (×3): qty 100

## 2015-04-12 MED ORDER — MORPHINE SULFATE (PF) 2 MG/ML IV SOLN
2.0000 mg | Freq: Once | INTRAVENOUS | Status: AC
  Administered 2015-04-12: 23:00:00 2 mg via INTRAVENOUS
  Filled 2015-04-12: qty 1

## 2015-04-12 MED ORDER — CEFTRIAXONE SODIUM 1 G IJ SOLR
1.0000 g | Freq: Once | INTRAMUSCULAR | Status: AC
Start: 2015-04-12 — End: 2015-04-12
  Administered 2015-04-12: 1 g via INTRAVENOUS
  Filled 2015-04-12: qty 10

## 2015-04-12 MED ORDER — DEXAMETHASONE SODIUM PHOSPHATE 10 MG/ML IJ SOLN
16.0000 mg | INTRAMUSCULAR | Status: DC
  Administered 2015-04-12 – 2015-04-13 (×2): 16 mg via INTRAVENOUS
  Filled 2015-04-12 (×2): qty 2

## 2015-04-12 MED ORDER — SODIUM CHLORIDE 0.9 % IV SOLN
INTRAVENOUS | Status: AC
  Administered 2015-04-12 – 2015-04-13 (×3): via INTRAVENOUS

## 2015-04-12 MED ORDER — DEXAMETHASONE SODIUM PHOSPHATE 10 MG/ML IJ SOLN
16.0000 mg | Freq: Once | INTRAMUSCULAR | Status: AC
  Administered 2015-04-12: 16:00:00 16 mg via INTRAVENOUS
  Filled 2015-04-12: qty 2

## 2015-04-12 MED ORDER — MORPHINE SULFATE (PF) 2 MG/ML IV SOLN
1.0000 mg | INTRAVENOUS | Status: DC | PRN
  Administered 2015-04-12 – 2015-04-13 (×7): 1 mg via INTRAVENOUS
  Filled 2015-04-12 (×7): qty 1

## 2015-04-12 MED ORDER — ACETAMINOPHEN 325 MG PO TABS: 650.0000 mg | ORAL_TABLET | ORAL | Status: DC | PRN

## 2015-04-12 NOTE — Progress Notes (Signed)
She was admitted in the hospital today.  Please refer to history and physical dictated in the hospital

## 2015-04-12 NOTE — Telephone Encounter (Signed)
Called report to [place] on 1-C.  Patient being admitted to [facility] for altered mental status.  Stage 4 cholangiocarcinoma with brain mets.  Will receive steroids, have MRI and MD will decide treatment options.  Called orderly who transferred patient to room #124 via patient's wheelchair.

## 2015-04-12 NOTE — H&P (Signed)
[place] @ [facility] [phone]  [phone] [place]: May 17, 1945  MR#: [id number]  XYV#:[id number]  Patient Care Team: Juline Patch, MD as PCP - General (Family Medicine) Forest Gleason, MD (Unknown Physician Specialty) Christene Lye, MD (General Surgery)  CHIEF COMPLAINT:  No chief complaint on file.   Oncology History   1. History of gallbladder carcinoma.Diagnosis in February of 2008 resection of poor to lymph node, resection of liver week for and findings segment and partial duodenal resection done at [facility] Final staging was T1, N0, M0 stage I gallbladder carcinoma. no followup adjuvant chemotherapy or radiation therapy. 2. Has abnormal MRI scan of the liver consistent with multiple metastases (September, 2014) 3. 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  Had resection of the right abdominal wall nodule.  (October, 2015) 8.  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  Biopsy was done at [facility].  Patient received radiation therapy (May of 2016)     Carcinoma gallbladder [facility])    Radiation Therapy   NTERVAL HISTORY: \  69 year old lady was brought in today as an acute add-on.  He received a phone call that patient was having increasing pain in the right flank over 2-3 weeks duration. Patient was supposed to get MRI scan today however patient could not cooperate.  Patient was now being evaluated.  Patient's condition has rapidly declining.  More and more confused.  Does not follow  any command.  Patient had a high BUN and high creatinine she was very confused and disoriented so was admitted in the hospital for altered mental status. Decrease oral intake. Review of systems Gen. status: Patient is extremely confused and agitated.  Does not follow any command most of the history was of pain from the family. HEENT: Decrease oral intake. Lungs: Increasing shortness of breath reported.  No cough. GI: Right upper quadrant pain.  Poor appetite.  No diarrhea. Persistent nausea GU: Patient is incontinent of urine She s incontinent of stool Lower extremity swelling Skin: No rash Neurological system: Very confused and agitated.  No other focal signs .  All rest of the systems have been reviewed  PAST MEDICAL HISTORY: Past Medical History  Diagnosis Date  . Cancer [facility]) 2007    gallbladder  . Metastasis from gallbladder cancer ([place]) 11/05/2014    PAST SURGICAL HISTORY: Past Surgical History  Procedure Laterality Date  . Cholecystectomy  03/2006  . Hand surgery Right 2012  . Portacath placement  01/2013  . Colonoscopy  2013  . Mass excision  02-01-14    abdominal wall    FAMILY HISTORY Family History  Problem Relation Age of Onset  . Other Father     brain tumor    ADVANCED DIRECTIVES:  Patient does not have any living will or healthcare power of attorney.  Information was given .  Available resources had been discussed.  We will follow-up on subsequent appointments regarding this issue HEALTH MAINTENANCE: Social History  Substance Use Topics  . Smoking status: Never Smoker   . Smokeless tobacco: Never Used  .  Alcohol Use: No      No Known Allergies  Current Facility-Administered Medications  Medication Dose Route Frequency Provider Last Rate Last Dose  . 0.9 %  sodium chloride infusion   Intravenous Continuous Forest Gleason, MD 100 mL/hr at 03/30/2015 1724    . acetaminophen (TYLENOL) tablet 650 mg  650 mg Oral Q4H PRN Evlyn Kanner, NP      .  cefTRIAXone (ROCEPHIN) 1 g in dextrose 5 % 50 mL IVPB  1 g Intravenous Once Forest Gleason, MD      . ciprofloxacin (CIPRO) IVPB 200 mg  200 mg Intravenous Q12H Forest Gleason, MD      . dexamethasone (DECADRON) 10 MG/ML injection           . [START ON 04/13/2015] dexamethasone (DECADRON) injection 16 mg  16 mg Intravenous Q24H Evlyn Kanner, NP      . LORazepam (ATIVAN) injection 0.5 mg  0.5 mg Intravenous Q12H PRN Evlyn Kanner, NP      . morphine 2 MG/ML injection 1 mg  1 mg Intravenous Q1H PRN Evlyn Kanner, NP   1 mg at 03/31/2015 1604  . pantoprazole (PROTONIX) injection 40 mg  40 mg Intravenous QHS Evlyn Kanner, NP        OBJECTIVE:     Body mass index is 27.41 kg/(m^2).    ECOG FS:1 - Symptomatic but completely ambulatory  PHYSICAL EXAM: Gen. status: Patient is extremely confused disoriented.  Does not follow any command HEENT: Mucous membranes are dry.  No jaundice. Skin: Poor skin turgor Cardiac: Tachycardia Lungs: Diminished at entry on both sides GI: Tenderness in right upper quadrant.  Bowel sounds are present. Lower extremity edema Neurological system: Higher functions patient is disoriented confused Cranial nerves are intact Upper and lower extremity power and tone difficult to assess Deep tendon reflexes are raised All other systems have been examined  LAB RESULTS:  Admission on 04/23/2015  Component Date Value Ref Range Status  . WBC 04/18/2015 29.5* 3.6 - 11.0 K/uL Final  . RBC 04/05/2015 3.32* 3.80 - 5.20 MIL/uL Final  . Hemoglobin 04/26/2015 9.3* 12.0 - 16.0 g/dL Final  . HCT 04/03/2015 27.8* 35.0 - 47.0 % Final  . MCV 04/23/2015 83.8  80.0 - 100.0 fL Final  . MCH 04/19/2015 28.0  26.0 - 34.0 pg Final  . MCHC 04/05/2015 33.4  32.0 - 36.0 g/dL Final  . RDW 04/11/2015 16.7* 11.5 - 14.5 % Final  . Platelets 04/19/2015 72* 150 - 440 K/uL Final  . Neutrophils Relative % 04/17/2015 93.000000   Final  . Lymphocytes Relative 04/25/2015 1.000000   Final  .  Monocytes Relative 04/25/2015 6.000000   Final  . Eosinophils Relative 04/23/2015 0.000000   Final  . Basophils Relative 04/29/2015 0.000000   Final  . Neutro Abs 04/11/2015 27.4* 1.4 - 6.5 K/uL Final  . Lymphs Abs 04/19/2015 0.3* 1.0 - 3.6 K/uL Final  . Monocytes Absolute 04/22/2015 1.8* 0.2 - 0.9 K/uL Final  . Eosinophils Absolute 04/11/2015 0.0  0 - 0.7 K/uL Final  . Basophils Absolute 04/19/2015 0.0  0 - 0.1 K/uL Final  . RBC Morphology 04/19/2015 MIXED RBC POPULATION   Final   HYPOCHROMIC  . Sodium 04/09/2015 132* 135 - 145 mmol/L Final  . Potassium 04/05/2015 4.8  3.5 - 5.1 mmol/L Final  . Chloride 04/09/2015 99* 101 - 111 mmol/L Final  . CO2 04/03/2015 21* 22 - 32 mmol/L Final  . Glucose, Bld 04/16/2015 145* 65 -  99 mg/dL Final  . BUN 04/19/2015 100* 6 - 20 mg/dL Final  . Creatinine, Ser 04/08/2015 2.67* 0.44 - 1.00 mg/dL Final  . Calcium 04/21/2015 8.2* 8.9 - 10.3 mg/dL Final  . Total Protein 04/24/2015 7.1  6.5 - 8.1 g/dL Final  . Albumin 04/01/2015 1.9* 3.5 - 5.0 g/dL Final  . AST 04/04/2015 28  15 - 41 U/L Final  . ALT 04/16/2015 23  14 - 54 U/L Final  . Alkaline Phosphatase 04/08/2015 121  38 - 126 U/L Final  . Total Bilirubin 04/11/2015 5.1* 0.3 - 1.2 mg/dL Final  . GFR calc non Af Amer 04/14/2015 17* >60 mL/min Final  . GFR calc Af Amer 04/01/2015 20* >60 mL/min Final   Comment: (NOTE) The eGFR has been calculated using the CKD EPI equation. This calculation has not been validated in all clinical situations. eGFR's persistently <60 mL/min signify possible Chronic Kidney Disease.   . Anion gap 04/04/2015 12  5 - 15 Final  . Magnesium 04/01/2015 2.5* 1.7 - 2.4 mg/dL Final  . Color, Urine 04/11/2015 AMBER* YELLOW Final  . APPearance 04/27/2015 CLOUDY* CLEAR Final  . Glucose, UA 04/16/2015 NEGATIVE  NEGATIVE mg/dL Final  . Bilirubin Urine 04/23/2015 1+* NEGATIVE Final  . Ketones, ur 04/23/2015 NEGATIVE  NEGATIVE mg/dL Final  . Specific Gravity, Urine 04/10/2015 1.015   1.005 - 1.030 Final  . Hgb urine dipstick 04/17/2015 NEGATIVE  NEGATIVE Final  . pH 04/08/2015 5.0  5.0 - 8.0 Final  . Protein, ur 04/03/2015 NEGATIVE  NEGATIVE mg/dL Final  . Nitrite 04/05/2015 NEGATIVE  NEGATIVE Final  . Leukocytes, UA 04/27/2015 TRACE* NEGATIVE Final  . RBC / HPF 04/11/2015 NONE SEEN  0 - 5 RBC/hpf Final  . WBC, UA 04/22/2015 0-5  0 - 5 WBC/hpf Final  . Bacteria, UA 04/07/2015 MANY* NONE SEEN Final  . Squamous Epithelial / LPF 04/24/2015 0-5* NONE SEEN Final  . WBC Clumps 04/03/2015 PRESENT   Final  . Mucous 04/11/2015 PRESENT   Final  . Hyaline Casts, UA 04/21/2015 PRESENT   Final  Appointment on 04/09/2015  Component Date Value Ref Range Status  . WBC 04/03/2015 37.6* 3.6 - 11.0 K/uL Final  . RBC 04/07/2015 3.48* 3.80 - 5.20 MIL/uL Final  . Hemoglobin 04/19/2015 9.7* 12.0 - 16.0 g/dL Final  . HCT 04/11/2015 29.1* 35.0 - 47.0 % Final  . MCV 04/13/2015 83.6  80.0 - 100.0 fL Final  . MCH 04/19/2015 27.8  26.0 - 34.0 pg Final  . MCHC 04/29/2015 33.3  32.0 - 36.0 g/dL Final  . RDW 03/31/2015 16.8* 11.5 - 14.5 % Final  . Platelets 04/11/2015 86* 150 - 440 K/uL Final  . Neutrophils Relative % 04/05/2015 77   Final  . Band Neutrophils 04/22/2015 14   Final  . Lymphocytes Relative 04/29/2015 2   Final  . Monocytes Relative 04/11/2015 5   Final  . WBC Morphology 04/21/2015 TOXIC GRANULATION   Final   VACUOLATED NEUTROPHILS  . RBC Morphology 04/05/2015 MORPHOLOGY UNREMARKABLE   Final  . Smear Review 04/27/2015 PLATELETS APPEAR DECREASED   Final  . Metamyelocytes Relative 04/03/2015 1   Final  . Myelocytes 04/17/2015 1   Final  . Sodium 04/03/2015 128* 135 - 145 mmol/L Final  . Potassium 03/31/2015 4.6  3.5 - 5.1 mmol/L Final  . Chloride 04/06/2015 93* 101 - 111 mmol/L Final  . CO2 04/25/2015 23  22 - 32 mmol/L Final  . Glucose, Bld 04/26/2015 163* 65 - 99 mg/dL Final  .  BUN 04/01/2015 97* 6 - 20 mg/dL Final  . Creatinine, Ser 04/04/2015 2.61* 0.44 - 1.00 mg/dL Final   . Calcium 04/28/2015 8.4* 8.9 - 10.3 mg/dL Final  . Total Protein 04/18/2015 7.5  6.5 - 8.1 g/dL Final  . Albumin 04/03/2015 2.1* 3.5 - 5.0 g/dL Final  . AST 04/16/2015 37  15 - 41 U/L Final  . ALT 04/07/2015 25  14 - 54 U/L Final  . Alkaline Phosphatase 03/30/2015 131* 38 - 126 U/L Final  . Total Bilirubin 04/16/2015 4.7* 0.3 - 1.2 mg/dL Final  . GFR calc non Af Amer 04/25/2015 18* >60 mL/min Final  . GFR calc Af Amer 04/04/2015 20* >60 mL/min Final   Comment: (NOTE) The eGFR has been calculated using the CKD EPI equation. This calculation has not been validated in all clinical situations. eGFR's persistently <60 mL/min signify possible Chronic Kidney Disease.   . Anion gap 04/19/2015 12  5 - 15 Final   CA 19-9 is 80 (previously a month ago was 176)  ASSESSMENT: 1.  Altered mental status Possible bleed secondary to dehydration and acute renal failure Underlying urinary tract infection contributing to it Possibility of metastases disease to brain and progression to the brain metastases cannot be ruled out 2.  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  Increasing right upper quadrant pain possibility of recurrent disease  Plan Blood culture and urine culture emperic  antibiotic with IV Rocephin and Cipro while cultures are pending IV fluid Foley catheter MRI scan of brain tomorrow Adjust  antibiotic according to culture I discussed situation with patient his family and they have expressed strong desire for patient to be DO NOT RESUSCITATE as previously wished by the patient. Continue supportive therapy If patient's condition does not improve in next several days then possibility of hospice home can be considered    No matching staging information was found for the patient.  Forest Gleason, MD   04/24/2015 7:20 PM

## 2015-04-12 NOTE — Telephone Encounter (Signed)
Showed up in office stating he does not feel it is safe to take her home in the state she is in. Offered appt for 130 and he agrees to bring her back art that time

## 2015-04-12 NOTE — Telephone Encounter (Signed)
Called to request order for sedation to be used to get her MRI and CT, Ok per Dr Oliva Bustard. [facility] called back and said that they discussed this with Dr Domenica Reamer [facility] who felt the patient is not stable enough to have sedation and needs to be evaluated for her deterioration by MD. If questions for Dr Weldon Picking, she can be reached at [phone].

## 2015-04-12 NOTE — Progress Notes (Signed)
Dr. Rogue Bussing notified r/t pt inability to chew or understand what to do with food in her mouth at this time.  Made NPO except ice, Creat 2.61gfr 20, MRI changed to without contrast, IVF increased at this time.

## 2015-04-12 NOTE — Plan of Care (Signed)
Problem: Education: Goal: Knowledge of [facility] Education information/materials will improve Outcome: Progressing Pt admitted to room 124, pt nonverbal except for groaning at this time. Family education done r/t test and procedures and medications at this time.  Voiced understanding.

## 2015-04-12 NOTE — Telephone Encounter (Signed)
Per Dr Jeb Levering will see tomorrow as scheduled and discuss further options
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
## 2015-04-13 ENCOUNTER — Inpatient Hospital Stay: Payer: Medicare Other

## 2015-04-13 ENCOUNTER — Ambulatory Visit: Payer: Medicare Other | Admitting: Oncology

## 2015-04-13 ENCOUNTER — Other Ambulatory Visit: Payer: Medicare Other

## 2015-04-13 DIAGNOSIS — D72829 Elevated white blood cell count, unspecified: Secondary | ICD-10-CM

## 2015-04-13 DIAGNOSIS — A4151 Sepsis due to Escherichia coli [E. coli]: Principal | ICD-10-CM

## 2015-04-13 DIAGNOSIS — Z515 Encounter for palliative care: Secondary | ICD-10-CM

## 2015-04-13 DIAGNOSIS — A419 Sepsis, unspecified organism: Secondary | ICD-10-CM

## 2015-04-13 LAB — BLOOD CULTURE ID PANEL (REFLEXED)
ACINETOBACTER BAUMANNII: NOT DETECTED
CANDIDA ALBICANS: NOT DETECTED
CANDIDA GLABRATA: NOT DETECTED
CANDIDA KRUSEI: NOT DETECTED
CANDIDA PARAPSILOSIS: NOT DETECTED
CANDIDA TROPICALIS: NOT DETECTED
Carbapenem resistance: NOT DETECTED
ENTEROBACTER CLOACAE COMPLEX: NOT DETECTED
ENTEROBACTERIACEAE SPECIES: NOT DETECTED
ESCHERICHIA COLI: DETECTED — AB
Enterococcus species: NOT DETECTED
HAEMOPHILUS INFLUENZAE: NOT DETECTED
KLEBSIELLA OXYTOCA: NOT DETECTED
KLEBSIELLA PNEUMONIAE: NOT DETECTED
Listeria monocytogenes: NOT DETECTED
Methicillin resistance: NOT DETECTED
Neisseria meningitidis: NOT DETECTED
Proteus species: NOT DETECTED
Pseudomonas aeruginosa: NOT DETECTED
STREPTOCOCCUS AGALACTIAE: NOT DETECTED
STREPTOCOCCUS PNEUMONIAE: NOT DETECTED
STREPTOCOCCUS PYOGENES: NOT DETECTED
STREPTOCOCCUS SPECIES: NOT DETECTED
Serratia marcescens: NOT DETECTED
Staphylococcus aureus (BCID): NOT DETECTED
Staphylococcus species: NOT DETECTED
Vancomycin resistance: NOT DETECTED

## 2015-04-13 LAB — COMPREHENSIVE METABOLIC PANEL
ALK PHOS: 90 U/L (ref 38–126)
ALT: 19 U/L (ref 14–54)
ANION GAP: 6 (ref 5–15)
AST: 20 U/L (ref 15–41)
Albumin: 1.5 g/dL — ABNORMAL LOW (ref 3.5–5.0)
BILIRUBIN TOTAL: 3.5 mg/dL — AB (ref 0.3–1.2)
BUN: 84 mg/dL — ABNORMAL HIGH (ref 6–20)
CALCIUM: 7.5 mg/dL — AB (ref 8.9–10.3)
CO2: 23 mmol/L (ref 22–32)
CREATININE: 1.9 mg/dL — AB (ref 0.44–1.00)
Chloride: 104 mmol/L (ref 101–111)
GFR, EST AFRICAN AMERICAN: 30 mL/min — AB (ref >60)
GFR, EST NON AFRICAN AMERICAN: 26 mL/min — AB (ref >60)
Glucose, Bld: 144 mg/dL — ABNORMAL HIGH (ref 65–99)
Potassium: 4.6 mmol/L (ref 3.5–5.1)
SODIUM: 133 mmol/L — AB (ref 135–145)
TOTAL PROTEIN: 6 g/dL — AB (ref 6.5–8.1)

## 2015-04-13 LAB — CBC WITH DIFFERENTIAL/PLATELET
Basophils Absolute: 0 10*3/uL (ref 0–0.1)
Basophils Relative: 0 %
EOS PCT: 0 %
Eosinophils Absolute: 0 10*3/uL (ref 0–0.7)
HEMATOCRIT: 22.9 % — AB (ref 35.0–47.0)
HEMOGLOBIN: 7.7 g/dL — AB (ref 12.0–16.0)
LYMPHS ABS: 0.2 10*3/uL — AB (ref 1.0–3.6)
Lymphocytes Relative: 1 %
MCH: 28.1 pg (ref 26.0–34.0)
MCHC: 33.4 g/dL (ref 32.0–36.0)
MCV: 84.1 fL (ref 80.0–100.0)
MONOS PCT: 2 %
Monocytes Absolute: 0.4 10*3/uL (ref 0.2–0.9)
NEUTROS ABS: 19.6 10*3/uL — AB (ref 1.4–6.5)
Neutrophils Relative %: 97 %
Platelets: 63 10*3/uL — ABNORMAL LOW (ref 150–440)
RBC: 2.73 MIL/uL — AB (ref 3.80–5.20)
RDW: 17.1 % — ABNORMAL HIGH (ref 11.5–14.5)
SMEAR REVIEW: DECREASED
WBC: 20.2 10*3/uL — AB (ref 3.6–11.0)

## 2015-04-13 LAB — CANCER ANTIGEN 19-9: CA 19 9: 99 U/mL — AB (ref 0–35)

## 2015-04-13 MED ORDER — ONDANSETRON HCL 4 MG/2ML IJ SOLN: 4.0000 mg | Freq: Four times a day (QID) | INTRAMUSCULAR | Status: DC | PRN

## 2015-04-13 MED ORDER — SODIUM CHLORIDE 0.9 % IV SOLN
500.0000 mg | Freq: Two times a day (BID) | INTRAVENOUS | Status: DC
  Administered 2015-04-13 – 2015-04-14 (×2): 500 mg via INTRAVENOUS
  Filled 2015-04-13 (×4): qty 0.5

## 2015-04-13 MED ORDER — MORPHINE SULFATE (PF) 2 MG/ML IV SOLN: 2.0000 mg | INTRAVENOUS | Status: DC | PRN

## 2015-04-13 MED ORDER — CIPROFLOXACIN IN D5W 200 MG/100ML IV SOLN
200.0000 mg | Freq: Two times a day (BID) | INTRAVENOUS | Status: DC
  Administered 2015-04-13: 20:00:00 200 mg via INTRAVENOUS
  Filled 2015-04-13 (×2): qty 100

## 2015-04-13 MED ORDER — PROCHLORPERAZINE 25 MG RE SUPP
25.0000 mg | Freq: Three times a day (TID) | RECTAL | Status: DC | PRN
  Filled 2015-04-13: qty 1

## 2015-04-13 MED ORDER — SODIUM CHLORIDE 0.9 % IV SOLN
INTRAVENOUS | Status: DC
  Administered 2015-04-13: 20:00:00 via INTRAVENOUS

## 2015-04-13 MED ORDER — MORPHINE SULFATE (PF) 2 MG/ML IV SOLN: 1.0000 mg | INTRAVENOUS | Status: DC | PRN

## 2015-04-13 MED ORDER — MORPHINE SULFATE (PF) 2 MG/ML IV SOLN
1.0000 mg | INTRAVENOUS | Status: DC | PRN
  Administered 2015-04-13 – 2015-04-14 (×3): 1 mg via INTRAVENOUS
  Filled 2015-04-13 (×3): qty 1

## 2015-04-13 MED ORDER — LORAZEPAM 2 MG/ML IJ SOLN: 0.5000 mg | INTRAMUSCULAR | Status: DC | PRN

## 2015-04-13 MED ORDER — MORPHINE 100MG IN NS 100ML (1MG/ML) PREMIX INFUSION: 1.0000 mg/h | INTRAVENOUS | Status: DC

## 2015-04-13 NOTE — Progress Notes (Signed)
Palliative care consult has been reviewed.  Patient and family's desire at this point in time to continue supportive therapy. 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 patient can have NG tube and feeding can be started.  Overall prognosis is poor because of multiple comorbid condition Renal failure appears to be prerenal and due to dehydration and sepsis Overall picture may be secondary to dehydration, sepsis

## 2015-04-13 NOTE — Progress Notes (Signed)
Pharmacy Antibiotic Follow-up Note  Marcia Collins is a 69 y.o. year-old female admitted on 04/03/2015.  The patient is currently on day 2 of Cipro and Ceftriaxone for possible UTI.  Assessment/Plan: After discussion with Dr. Oliva Bustard, BCID results were discussed and patient will be narrowed to Meropenem. Further narrowing will be determined based on finalized susceptibilities.  Temp (24hrs), Avg:96.9 F (36.1 C), Min:94.9 F (34.9 C), Max:97.6 F (36.4 C)   Recent Labs Lab 03/30/2015 1210 04/28/2015 1712 04/13/15 0515  WBC 37.6* 29.5* 20.2*    Recent Labs Lab 04/05/2015 1210 04/16/2015 1712 04/13/15 0515  CREATININE 2.61* 2.67* 1.90*   Estimated Creatinine Clearance: 31.3 mL/min (by C-G formula based on Cr of 1.9).    No Known Allergies  Antimicrobials this admission:  cipro 200mg  IV  >> 12/14 to 12/15 Ceftriaxone 1g IV >> 12/14 to 12/15 Meropenem 500mg  IV q12h >> 12/14   Microbiology results: 12/14 BCx: E.coli x 4 bottles  Thank you for allowing pharmacy to be a part of this patient's care.  Paulina Fusi, PharmD, BCPS 04/13/2015 12:18 PM

## 2015-04-13 NOTE — Progress Notes (Signed)
[facility] @ [facility] [phone]  Fax:([phone] [place]: 07-19-1945  MR#: [id number]  UQJ#:[id number]  Patient Care Team: Juline Patch, MD as PCP - General (Family Medicine) Forest Gleason, MD (Unknown Physician Specialty) Christene Lye, MD (General Surgery)  CHIEF COMPLAINT:  No chief complaint on file.   Oncology History   1. History of gallbladder carcinoma.Diagnosis in February of 2008 resection of poor to lymph node, resection of liver week for and findings segment and partial duodenal resection done at [facility] Final staging was T1, N0, M0 stage I gallbladder carcinoma. no followup adjuvant chemotherapy or radiation therapy. 2. Has abnormal MRI scan of the liver consistent with multiple metastases (September, 2014) 3. 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  Had resection of the right abdominal wall nodule.  (October, 2015) 8.  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  Biopsy was done at [facility].  Patient received radiation therapy (May of 2016)     Carcinoma gallbladder [facility])    Radiation Therapy   NTERVAL HISTORY: \  69 year old lady was brought in today as an acute add-on.  He received a phone call that patient was having increasing pain in the right flank over 2-3 weeks duration. Patient was supposed to get MRI scan today however patient could not cooperate.  Patient was now being evaluated.  Patient's condition has rapidly declining.  More and more confused.  Does not follow  any command.  Patient had a high BUN and high creatinine she was very confused and disoriented so was admitted in the hospital for altered mental status. Decrease oral intake.  Patient's blood cultures are positive for Escherichia coli sepsis  Will start patient on imipenem  Condition has been declining patient ispoorly responsive.   Review of systems Gen. status: Patient is extremely confused and agitated.  Does not follow any command most of the history was of pain from the family. HEENT: Decrease oral intake. Lungs: Increasing shortness of breath reported.  No cough. GI: Right upper quadrant pain.  Poor appetite.  No diarrhea. Persistent nausea GU: Patient is incontinent of urine She s incontinent of stool Lower extremity swelling Skin: No rash Neurological system: Very confused and agitated.  No other focal signs .  All rest of the systems have been reviewed  PAST MEDICAL HISTORY: Past Medical History  Diagnosis Date  . Cancer [facility]) 2007    gallbladder  . Metastasis from gallbladder cancer ([place]) 11/05/2014    PAST SURGICAL HISTORY: Past Surgical History  Procedure Laterality Date  . Cholecystectomy  03/2006  . Hand surgery Right 2012  . Portacath placement  01/2013  . Colonoscopy  2013  . Mass excision  02-01-14    abdominal wall    FAMILY HISTORY Family History  Problem Relation Age of Onset  . Other Father     brain tumor    ADVANCED DIRECTIVES:  Patient does not have any living will or healthcare power of attorney.  Information was given .  Available resources had been discussed.  We will follow-up on subsequent appointments  regarding this issue HEALTH MAINTENANCE: Social History  Substance Use Topics  . Smoking status: Never Smoker   . Smokeless tobacco: Never Used  . Alcohol Use: No      No Known Allergies  Current Facility-Administered Medications  Medication Dose Route Frequency Provider Last Rate Last Dose  . 0.9 %  sodium chloride infusion    Intravenous Continuous Cammie Sickle, MD      . acetaminophen (TYLENOL) tablet 650 mg  650 mg Oral Q4H PRN Evlyn Kanner, NP      . dexamethasone (DECADRON) injection 16 mg  16 mg Intravenous Q24H Evlyn Kanner, NP   16 mg at 04/11/2015 2254  . LORazepam (ATIVAN) injection 0.5 mg  0.5 mg Intravenous Q4H PRN Colleen Can, MD      . meropenem [facility]) 500 mg in sodium chloride 0.9 % 50 mL IVPB  500 mg Intravenous Q12H Forest Gleason, MD   500 mg at 04/13/15 1338  . morphine 1477m in NS 107m(77m70mL) infusion - premix  1 mg/hr Intravenous Continuous MarColleen CanD      . morphine 2 MG/ML injection 1 mg  1 mg Intravenous Q1H PRN LesEvlyn KannerP   1 mg at 04/13/15 1338  . pantoprazole (PROTONIX) injection 40 mg  40 mg Intravenous QHS LesEvlyn KannerP   40 mg at 04/01/2015 2255  . prochlorperazine (COMPAZINE) suppository 25 mg  25 mg Rectal Q8H PRN MarColleen CanD        OBJECTIVE:     Body mass index is 27.41 kg/(m^2).    ECOG FS:1 - Symptomatic but completely ambulatory  PHYSICAL EXAM: Gen. status: Patient is extremely confused disoriented.  Does not follow any command HEENT: Mucous membranes are dry.  No jaundice. Skin: Poor skin turgor Cardiac: Tachycardia Lungs: Diminished at entry on both sides GI: Tenderness in right upper quadrant.  Bowel sounds are present. Lower extremity edema Neurological system: Higher functions patient is disoriented confused Cranial nerves are intact Upper and lower extremity power and tone difficult to assess Deep tendon reflexes are raised All other systems have been examined  LAB RESULTS:  Admission on 04/16/2015  Component Date Value Ref Range Status  . WBC 04/13/2015 29.5* 3.6 - 11.0 K/uL Final  . RBC 04/19/2015 3.32* 3.80 - 5.20 MIL/uL Final  . Hemoglobin 04/11/2015 9.3* 12.0 - 16.0 g/dL Final  . HCT 04/27/2015 27.8* 35.0 - 47.0 % Final  . MCV 04/16/2015 83.8  80.0 - 100.0 fL Final  . MCH 04/17/2015 28.0   26.0 - 34.0 pg Final  . MCHC 04/10/2015 33.4  32.0 - 36.0 g/dL Final  . RDW 04/24/2015 16.7* 11.5 - 14.5 % Final  . Platelets 04/07/2015 72* 150 - 440 K/uL Final  . Neutrophils Relative % 04/21/2015 93.000000   Final  . Lymphocytes Relative 04/22/2015 1.000000   Final  . Monocytes Relative 04/21/2015 6.000000   Final  . Eosinophils Relative 04/28/2015 0.000000   Final  . Basophils Relative 04/23/2015 0.000000   Final  . Neutro Abs 04/21/2015 27.4* 1.4 - 6.5 K/uL Final  . Lymphs Abs 03/30/2015 0.3* 1.0 - 3.6 K/uL Final  . Monocytes Absolute 04/02/2015 1.8* 0.2 - 0.9 K/uL Final  . Eosinophils Absolute 04/19/2015 0.0  0 - 0.7 K/uL Final  . Basophils Absolute 04/17/2015 0.0  0 - 0.1 K/uL Final  . RBC Morphology 04/18/2015 MIXED RBC POPULATION   Final   HYPOCHROMIC  . Sodium 04/20/2015 132* 135 - 145  mmol/L Final  . Potassium 04/07/2015 4.8  3.5 - 5.1 mmol/L Final  . Chloride 04/22/2015 99* 101 - 111 mmol/L Final  . CO2 04/19/2015 21* 22 - 32 mmol/L Final  . Glucose, Bld 04/28/2015 145* 65 - 99 mg/dL Final  . BUN 04/18/2015 100* 6 - 20 mg/dL Final  . Creatinine, Ser 04/23/2015 2.67* 0.44 - 1.00 mg/dL Final  . Calcium 04/13/2015 8.2* 8.9 - 10.3 mg/dL Final  . Total Protein 04/04/2015 7.1  6.5 - 8.1 g/dL Final  . Albumin 04/19/2015 1.9* 3.5 - 5.0 g/dL Final  . AST 04/28/2015 28  15 - 41 U/L Final  . ALT 04/01/2015 23  14 - 54 U/L Final  . Alkaline Phosphatase 04/19/2015 121  38 - 126 U/L Final  . Total Bilirubin 04/02/2015 5.1* 0.3 - 1.2 mg/dL Final  . GFR calc non Af Amer 04/10/2015 17* >60 mL/min Final  . GFR calc Af Amer 04/23/2015 20* >60 mL/min Final   Comment: (NOTE) The eGFR has been calculated using the CKD EPI equation. This calculation has not been validated in all clinical situations. eGFR's persistently <60 mL/min signify possible Chronic Kidney Disease.   . Anion gap 04/13/2015 12  5 - 15 Final  . Magnesium 04/29/2015 2.5* 1.7 - 2.4 mg/dL Final  . Specimen Description  04/06/2015 URINE, CLEAN CATCH   Final  . Special Requests 04/27/2015 NONE   Final  . Culture 04/01/2015    Final                   Value:>=100,000 COLONIES/mL GRAM NEGATIVE RODS IDENTIFICATION AND SUSCEPTIBILITIES TO FOLLOW   . Report Status 04/03/2015 PENDING   Incomplete  . Color, Urine 04/18/2015 AMBER* YELLOW Final  . APPearance 04/21/2015 CLOUDY* CLEAR Final  . Glucose, UA 04/25/2015 NEGATIVE  NEGATIVE mg/dL Final  . Bilirubin Urine 04/05/2015 1+* NEGATIVE Final  . Ketones, ur 04/14/2015 NEGATIVE  NEGATIVE mg/dL Final  . Specific Gravity, Urine 04/14/2015 1.015  1.005 - 1.030 Final  . Hgb urine dipstick 04/28/2015 NEGATIVE  NEGATIVE Final  . pH 04/16/2015 5.0  5.0 - 8.0 Final  . Protein, ur 04/06/2015 NEGATIVE  NEGATIVE mg/dL Final  . Nitrite 04/13/2015 NEGATIVE  NEGATIVE Final  . Leukocytes, UA 04/11/2015 TRACE* NEGATIVE Final  . RBC / HPF 04/21/2015 NONE SEEN  0 - 5 RBC/hpf Final  . WBC, UA 04/21/2015 0-5  0 - 5 WBC/hpf Final  . Bacteria, UA 04/01/2015 MANY* NONE SEEN Final  . Squamous Epithelial / LPF 04/27/2015 0-5* NONE SEEN Final  . WBC Clumps 04/25/2015 PRESENT   Final  . Mucous 04/09/2015 PRESENT   Final  . Hyaline Casts, UA 04/17/2015 PRESENT   Final  . Sodium 04/13/2015 133* 135 - 145 mmol/L Final  . Potassium 04/13/2015 4.6  3.5 - 5.1 mmol/L Final  . Chloride 04/13/2015 104  101 - 111 mmol/L Final  . CO2 04/13/2015 23  22 - 32 mmol/L Final  . Glucose, Bld 04/13/2015 144* 65 - 99 mg/dL Final  . BUN 04/13/2015 84* 6 - 20 mg/dL Final  . Creatinine, Ser 04/13/2015 1.90* 0.44 - 1.00 mg/dL Final  . Calcium 04/13/2015 7.5* 8.9 - 10.3 mg/dL Final  . Total Protein 04/13/2015 6.0* 6.5 - 8.1 g/dL Final  . Albumin 04/13/2015 1.5* 3.5 - 5.0 g/dL Final  . AST 04/13/2015 20  15 - 41 U/L Final  . ALT 04/13/2015 19  14 - 54 U/L Final  . Alkaline Phosphatase 04/13/2015 90  38 - 126 U/L  Final  . Total Bilirubin 04/13/2015 3.5* 0.3 - 1.2 mg/dL Final  . GFR calc non Af Amer  04/13/2015 26* >60 mL/min Final  . GFR calc Af Amer 04/13/2015 30* >60 mL/min Final   Comment: (NOTE) The eGFR has been calculated using the CKD EPI equation. This calculation has not been validated in all clinical situations. eGFR's persistently <60 mL/min signify possible Chronic Kidney Disease.   . Anion gap 04/13/2015 6  5 - 15 Final  . Specimen Description 04/14/2015 BLOOD RIGHT ANTECUBITAL   Final  . Special Requests 04/03/2015 BOTTLES DRAWN AEROBIC AND ANAEROBIC 10ML   Final  . Culture  Setup Time 04/10/2015    Final                   Value:GRAM NEGATIVE RODS IN BOTH AEROBIC AND ANAEROBIC BOTTLES Organism ID to follow CRITICAL RESULT CALLED TO, READ BACK BY AND VERIFIED WITH: CHRISTINE KATSOUDAS 04/13/15 1201PM MLM   . Culture 04/07/2015    Final                   Value:GRAM NEGATIVE RODS IN BOTH AEROBIC AND ANAEROBIC BOTTLES CRITICAL RESULT CALLED TO, READ BACK BY AND VERIFIED WITH: CHRISTINE KATSOUDAS 04/13/15 1201PM MLM   . Report Status 04/24/2015 PENDING   Incomplete  . Specimen Description 04/24/2015 BLOOD LEFT ANTECUBITAL   Final  . Special Requests 04/07/2015 BOTTLES DRAWN AEROBIC AND ANAEROBIC 5ML   Final  . Culture  Setup Time 04/27/2015    Final                   Value:GRAM NEGATIVE RODS IN BOTH AEROBIC AND ANAEROBIC BOTTLES CRITICAL RESULT CALLED TO, READ BACK BY AND VERIFIED WITH: CHRISTINE KATSOUDAS 04/13/15 1201PM MLM   . Culture 04/10/2015    Final                   Value:GRAM NEGATIVE RODS IN BOTH AEROBIC AND ANAEROBIC BOTTLES CRITICAL RESULT CALLED TO, READ BACK BY AND VERIFIED WITH: CHRISTINE KATSOUDAS 04/13/15 1201PM MLM   . Report Status 04/21/2015 PENDING   Incomplete  . WBC 04/13/2015 20.2* 3.6 - 11.0 K/uL Final  . RBC 04/13/2015 2.73* 3.80 - 5.20 MIL/uL Final  . Hemoglobin 04/13/2015 7.7* 12.0 - 16.0 g/dL Final  . HCT 04/13/2015 22.9* 35.0 - 47.0 % Final  . MCV 04/13/2015 84.1  80.0 - 100.0 fL Final  . MCH 04/13/2015 28.1  26.0 - 34.0 pg Final    . MCHC 04/13/2015 33.4  32.0 - 36.0 g/dL Final  . RDW 04/13/2015 17.1* 11.5 - 14.5 % Final  . Platelets 04/13/2015 63* 150 - 440 K/uL Final  . Neutrophils Relative % 04/13/2015 97   Final  . Lymphocytes Relative 04/13/2015 1   Final  . Monocytes Relative 04/13/2015 2   Final  . Eosinophils Relative 04/13/2015 0   Final  . Basophils Relative 04/13/2015 0   Final  . Neutro Abs 04/13/2015 19.6* 1.4 - 6.5 K/uL Final  . Lymphs Abs 04/13/2015 0.2* 1.0 - 3.6 K/uL Final  . Monocytes Absolute 04/13/2015 0.4  0.2 - 0.9 K/uL Final  . Eosinophils Absolute 04/13/2015 0.0  0 - 0.7 K/uL Final  . Basophils Absolute 04/13/2015 0.0  0 - 0.1 K/uL Final  . RBC Morphology 04/13/2015 MIXED RBC POPULATION   Final  . Smear Review 04/13/2015 PLATELETS APPEAR DECREASED   Final  . Enterococcus species 04/01/2015 NOT DETECTED  NOT DETECTED Final  . Listeria monocytogenes 03/31/2015  NOT DETECTED  NOT DETECTED Final  . Staphylococcus species 04/19/2015 NOT DETECTED  NOT DETECTED Final  . Staphylococcus aureus 04/13/2015 NOT DETECTED  NOT DETECTED Final  . Streptococcus species 04/24/2015 NOT DETECTED  NOT DETECTED Final  . Streptococcus agalactiae 04/29/2015 NOT DETECTED  NOT DETECTED Final  . Streptococcus pneumoniae 04/04/2015 NOT DETECTED  NOT DETECTED Final  . Streptococcus pyogenes 04/28/2015 NOT DETECTED  NOT DETECTED Final  . Acinetobacter baumannii 04/08/2015 NOT DETECTED  NOT DETECTED Final  . Enterobacteriaceae species 04/09/2015 NOT DETECTED  NOT DETECTED Final  . Enterobacter cloacae complex 04/26/2015 NOT DETECTED  NOT DETECTED Final  . Escherichia coli 04/21/2015 DETECTED* NOT DETECTED Final   CRITICAL RESULT VERFIED AND CALLED TO CHRISTINE KATSOUDAS 04/13/15 1201PM MLM  . Klebsiella oxytoca 04/11/2015 NOT DETECTED  NOT DETECTED Final  . Klebsiella pneumoniae 04/11/2015 NOT DETECTED  NOT DETECTED Final  . Proteus species 04/11/2015 NOT DETECTED  NOT DETECTED Final  . Serratia marcescens 04/21/2015  NOT DETECTED  NOT DETECTED Final  . Haemophilus influenzae 04/07/2015 NOT DETECTED  NOT DETECTED Final  . Neisseria meningitidis 04/04/2015 NOT DETECTED  NOT DETECTED Final  . Pseudomonas aeruginosa 04/23/2015 NOT DETECTED  NOT DETECTED Final  . Candida albicans 04/21/2015 NOT DETECTED  NOT DETECTED Final  . Candida glabrata 04/24/2015 NOT DETECTED  NOT DETECTED Final  . Candida krusei 04/13/2015 NOT DETECTED  NOT DETECTED Final  . Candida parapsilosis 04/10/2015 NOT DETECTED  NOT DETECTED Final  . Candida tropicalis 04/19/2015 NOT DETECTED  NOT DETECTED Final  . Carbapenem resistance 04/28/2015 NOT DETECTED  NOT DETECTED Final  . Methicillin resistance 04/21/2015 NOT DETECTED  NOT DETECTED Final  . Vancomycin resistance 04/13/2015 NOT DETECTED  NOT DETECTED Final  Appointment on 04/22/2015  Component Date Value Ref Range Status  . CA 19-9 04/14/2015 99* 0 - 35 U/mL Final   Comment: (NOTE) Roche ECLIA methodology Performed At: [facility] [place], [place] [id number] Lindon Romp MD PP:[id number]   . WBC 04/14/2015 37.6* 3.6 - 11.0 K/uL Final  . RBC 04/10/2015 3.48* 3.80 - 5.20 MIL/uL Final  . Hemoglobin 04/16/2015 9.7* 12.0 - 16.0 g/dL Final  . HCT 04/04/2015 29.1* 35.0 - 47.0 % Final  . MCV 04/07/2015 83.6  80.0 - 100.0 fL Final  . MCH 04/03/2015 27.8  26.0 - 34.0 pg Final  . MCHC 04/21/2015 33.3  32.0 - 36.0 g/dL Final  . RDW 04/01/2015 16.8* 11.5 - 14.5 % Final  . Platelets 04/18/2015 86* 150 - 440 K/uL Final  . Neutrophils Relative % 04/13/2015 77   Final  . Band Neutrophils 04/05/2015 14   Final  . Lymphocytes Relative 04/05/2015 2   Final  . Monocytes Relative 03/31/2015 5   Final  . WBC Morphology 04/13/2015 TOXIC GRANULATION   Final   VACUOLATED NEUTROPHILS  . RBC Morphology 04/13/2015 MORPHOLOGY UNREMARKABLE   Final  . Smear Review 04/29/2015 PLATELETS APPEAR DECREASED   Final  . Metamyelocytes Relative 04/02/2015 1   Final  . Myelocytes  03/30/2015 1   Final  . Sodium 04/14/2015 128* 135 - 145 mmol/L Final  . Potassium 04/11/2015 4.6  3.5 - 5.1 mmol/L Final  . Chloride 04/10/2015 93* 101 - 111 mmol/L Final  . CO2 04/06/2015 23  22 - 32 mmol/L Final  . Glucose, Bld 04/17/2015 163* 65 - 99 mg/dL Final  . BUN 04/06/2015 97* 6 - 20 mg/dL Final  . Creatinine, Ser 04/11/2015 2.61* 0.44 - 1.00 mg/dL Final  . Calcium  03/31/2015 8.4* 8.9 - 10.3 mg/dL Final  . Total Protein 04/21/2015 7.5  6.5 - 8.1 g/dL Final  . Albumin 04/25/2015 2.1* 3.5 - 5.0 g/dL Final  . AST 04/24/2015 37  15 - 41 U/L Final  . ALT 04/11/2015 25  14 - 54 U/L Final  . Alkaline Phosphatase 03/31/2015 131* 38 - 126 U/L Final  . Total Bilirubin 04/18/2015 4.7* 0.3 - 1.2 mg/dL Final  . GFR calc non Af Amer 03/30/2015 18* >60 mL/min Final  . GFR calc Af Amer 04/20/2015 20* >60 mL/min Final   Comment: (NOTE) The eGFR has been calculated using the CKD EPI equation. This calculation has not been validated in all clinical situations. eGFR's persistently <60 mL/min signify possible Chronic Kidney Disease.   . Anion gap 04/11/2015 12  5 - 15 Final   CA 19-9 is 80 (previously a month ago was 176)  ASSESSMENT: 1.  Altered mental status Possible bleed secondary to dehydration and acute renal failure Underlying urinary tract infection contributing to it Possibility of metastases disease to brain and progression to the brain metastases cannot be ruled out 2.  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  Increasing right upper quadrant pain possibility of recurrent disease  Plan Escherichia coli sepsis Increasing pain patient has been started on morphine drip by palliative care physician  Continue IV fluid  Change antibiotic  To  imnipenam.  Will continue supportive measures are measures of treatment of sepsis and IV fluid Recheck BUN/creatinine tomorrow And CBC Dr. Susy Manor will cover me in my absence    No matching  staging information was found for the patient.  Forest Gleason, MD   04/13/2015 4:39 PM

## 2015-04-13 NOTE — Clinical Social Work Note (Signed)
CSW consulted for "New SNF." CSW reviewed chart and spoke to [facility]. Full assessment to follow if appropriate. CSW will continue for discharge planning needs.   Darden Dates, MSW, LCSW Clinical Social Worker [phone]

## 2015-04-13 NOTE — Consult Note (Addendum)
[facility] [place] NAME: Marcia Collins    MR#:  [id number]  DATE OF BIRTH:  09/14/1945  DATE OF ADMISSION:  04/11/2015  PRIMARY CARE PHYSICIAN: Otilio Miu, MD   REQUESTING/REFERRING PHYSICIAN: Dr. Forest Gleason  CHIEF COMPLAINT:  No chief complaint on file.   HISTORY OF PRESENT ILLNESS:  [name]  is a 69 y.o. 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 Metro Kung office for worsening right flank pain and altered mental status. She was also reported to have decreased oral intake. She was noted to be in acute renal failure, elevated WBC count and her blood cultures from admission are growing gram-negative rods. Patient's mental status hasn't improved in the last day, remains in renal failure and having worsening oxygen requirements now. Medical consult is requested for medical management at this time. Agent is unable to provide any history at this time. Most of the history is obtained from previous records  PAST MEDICAL HISTORY:   Past Medical History  Diagnosis Date  . Cancer [facility]) 2007    gallbladder  . Metastasis from gallbladder cancer ([place]) 11/05/2014    PAST SURGICAL HISTOIRY:   Past Surgical History  Procedure Laterality Date  . Cholecystectomy  03/2006  . Hand surgery Right 2012  . Portacath placement  01/2013  . Colonoscopy  2013  . Mass excision  02-01-14    abdominal wall    SOCIAL HISTORY:   Social History  Substance Use Topics  . Smoking status: Never Smoker   . Smokeless tobacco: Never Used  . Alcohol Use: No    FAMILY HISTORY:   Family History  Problem Relation Age of Onset  . Other Father     brain tumor    DRUG ALLERGIES:  No Known Allergies  REVIEW OF SYSTEMS:   Review of Systems  Unable to perform ROS:  mental acuity    MEDICATIONS AT HOME:   Prior to Admission medications   Medication Sig Start Date End Date Taking? Authorizing Provider  citalopram (CELEXA) 20 MG tablet Take 1 tablet (20 mg total) by mouth daily. 12/28/14   Forest Gleason, MD  lisinopril (PRINIVIL,ZESTRIL) 20 MG tablet Take 1 tablet (20 mg total) by mouth daily. 12/28/14   Forest Gleason, MD  predniSONE (DELTASONE) 20 MG tablet Take 1 tablet (20 mg total) by mouth daily with breakfast. 04/04/15   Forest Gleason, MD  traMADol (ULTRAM) 50 MG tablet Take 1 tablet (50 mg total) by mouth daily. 03/29/15   Forest Gleason, MD      VITAL SIGNS:  Blood pressure 105/51, pulse 72, temperature 97.6 F (36.4 C), temperature source Axillary, resp. rate 26, height 5\' 8"  (1.727 m), weight 81.738 kg (180 lb 3.2 oz), SpO2 92 %.  PHYSICAL EXAMINATION:   Physical Exam  GENERAL:  69 y.o.-year-old patient, ill appearing, lying in bed, restless and moaning at times EYES: Pupils equal, round, reactive to light and accommodation. No scleral icterus. Extraocular muscles intact.  HEENT: Head atraumatic, normocephalic. Oropharynx and nasopharynx clear. Very dry mucus membranes NECK:  Supple, no jugular venous distention. No thyroid enlargement, no tenderness.  LUNGS: Normal breath sounds bilaterally, coarse rhonchi bibasilar and some crackles heard. no wheezing, or crepitation. No use of accessory muscles of respiration.  CARDIOVASCULAR: S1, S2 normal. No rubs, or gallops.  3/6 systolic murmur present ABDOMEN: Soft, nontender, nondistended. Bowel sounds present. No organomegaly or mass.  EXTREMITIES: No cyanosis, or clubbing. 3+ edema of both arms and also legs NEUROLOGIC: unable to assess neuro status as patient is lethargic, not following commands. Moving both arms at times.   PSYCHIATRIC: The patient is arousable, moaning, not oriented, incomprehensible words at times.Marland Kitchen  SKIN: No obvious rash, lesion, or ulcer.   LABORATORY PANEL:   CBC  Recent  Labs Lab 04/13/15 0515  WBC 20.2*  HGB 7.7*  HCT 22.9*  PLT 63*   ------------------------------------------------------------------------------------------------------------------  Chemistries   Recent Labs Lab 04/23/2015 1712 04/13/15 0515  NA 132* 133*  K 4.8 4.6  CL 99* 104  CO2 21* 23  GLUCOSE 145* 144*  BUN 100* 84*  CREATININE 2.67* 1.90*  CALCIUM 8.2* 7.5*  MG 2.5*  --   AST 28 20  ALT 23 19  ALKPHOS 121 90  BILITOT 5.1* 3.5*   ------------------------------------------------------------------------------------------------------------------  Cardiac Enzymes No results for input(s): TROPONINI in the last 168 hours. ------------------------------------------------------------------------------------------------------------------  RADIOLOGY:  No results found.  EKG:   Orders placed or performed in visit on 01/26/14  . EKG 12-Lead    IMPRESSION AND PLAN:   [name]  is a 69 y.o. 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 Metro Kung office for worsening right flank pain and altered mental status.   #1 sepsis-likely source urine. -Blood and urine cultures growing gram-negative bacteria. On meropenem for broader coverage. -And narrow antibiotics once sensitivities are available Infectious disease consulted. -WBC improving. However still high. Also of note patient is on steroids.  #2 altered mental status- likely metabolic encephalopathy from her sepsis. -She was alert and oriented in her last visit in October. Some memory complaints at this time. -She became nonverbal and just grunting or the last few days. Decreased oral intake as well. -MRI of the brain ordered for tomorrow morning to follow up on her brain metastases. However patient might not tolerate the MRI with her  oxygen requirements at this time. We'll get a stat CT head to rule out other causes. - Avoid sedatives.  #3 acute renal failure-likely ATN from her sepsis -Receiving IV fluids, decrease the rate due to anasarca at this time. -Continue to monitor urine output. -Abdominal ultrasound-can look at the kidneys to rule out hydronephrosis -Nephrology has been consulted  #4 anemia and thrombocytopenia-worsening anemia and thrombocytopenia. -Avoid anticoagulation at this time. Monitor hemoglobin and platelets. Transfuse RBCs if hemoglobin less than 7 and platelets if platelet count less than 20 K. -No active bleeding noted  #5 hypoxia-no chest x-ray was done. Patient has been receiving IV fluids at 1 50 cc per hour. 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 -Poor prognosis. Metastases to brain and has a repeat MRI scheduled for tomorrow morning-not sure if patient will be medically stable to tolerate an MRI tomorrow. Monitor for now. -On Decadron at this time daily. -Further management per oncology -Palliative care consulted -Failure to thrive-possible NG tube tomorrow if does not improve.  #7 GERD-patient is on Protonix daily  #8 DVT prophylaxis-on teds and SCDs  No family is available at this time for update.   All the records are reviewed and case discussed with Consulting provider. Management plans  discussed with the patient, family and they are in agreement.  CODE STATUS: DO NOT RESUSCITATE  TOTAL TIME TAKING CARE OF THIS PATIENT: 50 minutes.    Gladstone Lighter M.D on 04/13/2015 at 8:49 PM  Between 7am to 6pm - Pager - [phone]  After 6pm go to [email or web address] - password [email or web address] [facility]  [facility]  Office  [phone]  CC: Primary care Physician: Otilio Miu, MD

## 2015-04-13 NOTE — Plan of Care (Signed)
Problem: Safety: Goal: Ability to remain free from injury will improve Outcome: Progressing Patient non verbal and lethargic this shift.  She remained in bed throughout shift.  She is high fall risk.  Bed in lowest position and call bell within reach.  Bed alarm on throughout shift.  Problem: Pain Managment: Goal: General experience of comfort will improve Outcome: Progressing Patient constantly moaning and grimacing in early part of shift.  Paged and spoke with Dr. Jannifer Franklin.  Morphine increase to 2mg /hr.  Patient given additional morphine until pain under control.  Respirations and PAINAD within acceptable levels.  Problem: Skin Integrity: Goal: Risk for impaired skin integrity will decrease Outcome: Progressing Patient repositioned every two hours this shift to prevent skin breakdown.  Patient with Foley in place.  Problem: Fluid Volume: Goal: Ability to maintain a balanced intake and output will improve Outcome: Progressing .9 NS initiated at 125 mL/hr.  Lung sounds are diminished but clear.  Foley output beginning to lighten to straw yellow with adequate output.

## 2015-04-13 NOTE — Consult Note (Addendum)
Palliative Medicine Inpatient Consult Note   Name: Marcia Collins Date: 04/13/2015 MRN: [id number]  DOB: Jun 22, 1945  Referring Physician: Forest Gleason, MD  Palliative Care consult requested for this 69 y.o. female for goals of medical therapy in patient with an ecoli sepsis.  She has cholangiocarcinoma.  She lives with husband but son. 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  Is follewed by Dr. Oliva Bustard.  Had chemo through Sept 2015 and last radiation was June 2016.  She presented on 12/14 with acute right flank pain that had been building over a few weeks time.  Confused and not following commands.    TODAY'S DISCUSSIONS AND DECISIONS:  1.  Pt continues as DNR.  I placed a portable DNR form in the paper chart.  2.  I talked with son, Marcia Collins, who is HCPOA.  Also present was an older brother and spouse of Marcia Collins.  I learned that the pt's husband is 'not reliable' per these family members.  They do not feel that she can go home unless she is totally able to take care of herself as she was able to do before. We discussed options for facilities after hospital stay and I referred them to social work/ care mgr for further discussions to be held when pt is approaching discharge.  We reviewed the basics associated with   -------short term rehab (she would have to be appropriate for therapy and it would not last long)  ------long term care (they do not have the resources for this and pt does not have Medicaid per their report)  -----Hospice in the Home (they seemed to like this option)  -----Hospice Home (if pt is approaching death within weeks)  We will see how pt does over next four days of IV ABX and talk again.  3.  Family says pt 'would want a feeding tube for a short period of time if it would help her get over a short illness'  But she would not want to live on a feeding tube indefinitely.  I described an NG vs a PEG to family and they seemed to agree that a PEG would not be what she would want but short term NG  might be needed if she does not start eating soon. This is consistent with her Living Will which I have read.  4.  Family hopes she will start waking up and eating, however.  I was at first asked to order a morphine drip, but then, after learning that family is not ready for end of life care in pt, just clarified some IV injection orders for morphine on a prn basis.  Hopefully, as she recovers from sepsis, she will be more alert.  And thus eat more. Hopefully.  5.  I suggested that this could be the patient's way of transitioning to a dying phase of her life.  Family wants some imaging done if it can be done so they know more and either hope for recovery or deal with spreading cancer, etc.    6.  I will follow at a distance tomorrow and then will talk again with family Monday (unless new information such as imaging results would indicate a need for changing to comfort care soon).  I believe she will still be here then.      IMPRESSION: 1. Cholangiocarcinoma (gallbladder cancer) ---Diagnosed in 2008 ---resection of part of duodenum and liver resection in past ([facility]) ---was staged originally T1, N0, M0 stage 1 gallbladder carcinoma --now stage 4 ---then noted to have liver mets  and had chemo  ---mets to brain diagnosed in May of 2016 and proven by biopsy at [facility] and then had XRT 2.  Sepsis ---ecoli in 4/4 bld cx bottles ---GNR in urine cx so far 3.  Acute renal failure 4.  Anemia  5.  Leukocytosis 6.  Thrombocytopenia 7.   Weakness 8.   Anorexia 9.   Peripheral Neuropathy   REVIEW OF SYSTEMS:  Has been using a rolling walker for the last several weeks at home.  Has had many falls. Had a good appetite until last few weeks and since has had poor appetite.  Was fully independent until Feb 2016.  Very lethargic. Has pain and is on morphine drip  SPIRITUAL SUPPORT SYSTEM: Yes.  SOCIAL HISTORY:  reports that she has never smoked. She has never used smokeless tobacco. She reports that she  does not drink alcohol or use illicit drugs. Lives at home with husband.  Has legal documents as listed below.   LEGAL DOCUMENTS:  I placed a portable DNR form in the paper chart.   Living Will:   ---no life prolonging measures if incurable condition or inability to regain consciousness, or advanced dementia is present.   ----BUT tube feedings were checked off as being desired in the above situations.  ---HOWEVER, directive states to follow [place] directions if they differ from above.   HCPOA  = Marcia Collins ---alternate is Marcia Collins 'Marcia Sanger' Collins  CODE STATUS: DNR  PAST MEDICAL HISTORY: Past Medical History  Diagnosis Date  . Cancer [facility]) 2007    gallbladder  . Metastasis from gallbladder cancer ([place]) 11/05/2014    PAST SURGICAL HISTORY:  Past Surgical History  Procedure Laterality Date  . Cholecystectomy  03/2006  . Hand surgery Right 2012  . Portacath placement  01/2013  . Colonoscopy  2013  . Mass excision  02-01-14    abdominal wall    ALLERGIES:  has No Known Allergies.  MEDICATIONS:  Current Facility-Administered Medications  Medication Dose Route Frequency Provider Last Rate Last Dose  . 0.9 %  sodium chloride infusion   Intravenous Continuous Cammie Sickle, MD 150 mL/hr at 04/13/15 1234    . 0.9 %  sodium chloride infusion   Intravenous Continuous Cammie Sickle, MD      . acetaminophen (TYLENOL) tablet 650 mg  650 mg Oral Q4H PRN Evlyn Kanner, NP      . dexamethasone (DECADRON) injection 16 mg  16 mg Intravenous Q24H Evlyn Kanner, NP   16 mg at 04/21/2015 2254  . LORazepam (ATIVAN) injection 0.5 mg  0.5 mg Intravenous Q12H PRN Evlyn Kanner, NP   0.5 mg at 04/02/2015 2039  . meropenem (MERREM) 500 mg in sodium chloride 0.9 % 50 mL IVPB  500 mg Intravenous Q12H Forest Gleason, MD   500 mg at 04/13/15 1338  . morphine 2 MG/ML injection 1 mg  1 mg Intravenous Q1H PRN Evlyn Kanner, NP   1 mg at 04/13/15 1338  . pantoprazole (PROTONIX)  injection 40 mg  40 mg Intravenous QHS Evlyn Kanner, NP   40 mg at 04/29/2015 2255    Vital Signs: BP 105/51 mmHg  Pulse 72  Temp(Src) 97.6 F (36.4 C) (Axillary)  Resp 26  Ht 5\' 8"  (1.727 m)  Wt 81.738 kg (180 lb 3.2 oz)  BMI 27.41 kg/m2  SpO2 92% Filed Weights   04/05/2015 1552  Weight: 81.738 kg (180 lb 3.2 oz)    Estimated body  mass index is 27.41 kg/(m^2) as calculated from the following:   Height as of this encounter: 5\' 8"  (1.727 m).   Weight as of this encounter: 81.738 kg (180 lb 3.2 oz).  PERFORMANCE STATUS (ECOG) : 4 - Bedbound  PHYSICAL EXAM:         LABS: CBC:    Component Value Date/Time   WBC 20.2* 04/13/2015 0515   WBC 11.0 08/08/2014 0552   HGB 7.7* 04/13/2015 0515   HGB 12.3 08/08/2014 0552   HCT 22.9* 04/13/2015 0515   HCT 37.4 08/08/2014 0552   PLT 63* 04/13/2015 0515   PLT 146* 08/08/2014 0552   MCV 84.1 04/13/2015 0515   MCV 90 08/08/2014 0552   NEUTROABS 19.6* 04/13/2015 0515   NEUTROABS 10.1* 08/08/2014 0552   LYMPHSABS 0.2* 04/13/2015 0515   LYMPHSABS 0.5* 08/08/2014 0552   MONOABS 0.4 04/13/2015 0515   MONOABS 0.4 08/08/2014 0552   EOSABS 0.0 04/13/2015 0515   EOSABS 0.0 08/08/2014 0552   BASOSABS 0.0 04/13/2015 0515   BASOSABS 0.0 08/08/2014 0552   Comprehensive Metabolic Panel:    Component Value Date/Time   NA 133* 04/13/2015 0515   NA 140 08/08/2014 0552   K 4.6 04/13/2015 0515   K 3.9 08/08/2014 0552   CL 104 04/13/2015 0515   CL 106 08/08/2014 0552   CO2 23 04/13/2015 0515   CO2 26 08/08/2014 0552   BUN 84* 04/13/2015 0515   BUN 25* 08/08/2014 0552   CREATININE 1.90* 04/13/2015 0515   CREATININE 0.72 08/08/2014 0552   GLUCOSE 144* 04/13/2015 0515   GLUCOSE 123* 08/08/2014 0552   CALCIUM 7.5* 04/13/2015 0515   CALCIUM 9.4 08/08/2014 0552   AST 20 04/13/2015 0515   AST 29 08/06/2014 1736   ALT 19 04/13/2015 0515   ALT 25 08/06/2014 1736   ALKPHOS 90 04/13/2015 0515   ALKPHOS 75 08/06/2014 1736   BILITOT  3.5* 04/13/2015 0515   BILITOT 0.6 08/06/2014 1736   PROT 6.0* 04/13/2015 0515   PROT 7.7 08/06/2014 1736   ALBUMIN 1.5* 04/13/2015 0515   ALBUMIN 4.3 08/06/2014 1736    More than 50% of the visit was spent in counseling/coordination of care: Yes  Time Spent: 85 minutes

## 2015-04-13 NOTE — Care Management (Addendum)
Admitted to [facility] with the diagnosis of cholangiocarcinoma. Lives with husband. Son, [name] is Power of [place] [phone]). Last seen Dr. Otilio Miu 2 years ago. Goes to the [facility] and sees Dr. Jeb Levering. Last chemotherapy September 2014-September 2015. Radiation until June 2016. No home health. No skilled facility. No home oxygen. Using rolling walker the last few weeks per son. Many falls for the lat 3 weeks. Great appetite until the last few weeks. Poor po intake last few weeks.  Son states that his mother was totally independent until February 2016.  Sons are at the bedside. Shelbie Ammons RN MSN CCM Care Management [phone]

## 2015-04-13 NOTE — Progress Notes (Signed)
Informed by nursing staff for results of recent chest x-ray revealing small apical pneumothorax and right-sided Given size continue to observe we'll get a routine consult for surgery however thoracentesis is likely not required at this time Pulmonary edema is present which is a more pressing issue will hold IV fluids at this time which places her in a somewhat difficult situation given worsening renal function.

## 2015-04-13 NOTE — Progress Notes (Signed)
[place] @ [facility] [phone]  Fax:([phone]) [place]: 1946-02-05  MR#: [id number]  ZHY#:[id number]  Patient Care Team: Juline Patch, MD as PCP - General (Family Medicine) Forest Gleason, MD (Unknown Physician Specialty) Christene Lye, MD (General Surgery)  CHIEF COMPLAINT:  No chief complaint on file.   Oncology History   1. History of gallbladder carcinoma.Diagnosis in February of 2008 resection of poor to lymph node, resection of liver week for and findings segment and partial duodenal resection done at [facility] Final staging was T1, N0, M0 stage I gallbladder carcinoma. no followup adjuvant chemotherapy or radiation therapy. 2. Has abnormal MRI scan of the liver consistent with multiple metastases (September, 2014) 3. 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  Had resection of the right abdominal wall nodule.  (October, 2015) 8.  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  Biopsy was done at [facility].  Patient received radiation therapy (May of 2016)     Carcinoma gallbladder [facility])    Radiation Therapy   NTERVAL HISTORY: \  69 year old lady was brought in today as an acute add-on.  He received a phone call that patient was having increasing pain in the right flank over 2-3 weeks duration. Patient was supposed to get MRI scan today however patient could not cooperate.  Patient was now being evaluated.  Patient's condition has rapidly declining.  More and more confused.  Does not follow  any command.  Patient had a high BUN and high creatinine she was very confused and disoriented so was admitted in the hospital for altered mental status. Decrease oral intake.  Patient's blood cultures are positive for Escherichia coli sepsis  Will start patient on imipenem  Condition has been declining patient ispoorly responsive.   Review of systems Gen. status: Patient is extremely confused and agitated.  Does not follow any command most of the history was of pain from the family. HEENT: Decrease oral intake. Lungs: Increasing shortness of breath reported.  No cough. GI: Right upper quadrant pain.  Poor appetite.  No diarrhea. Persistent nausea GU: Patient is incontinent of urine She s incontinent of stool Lower extremity swelling Skin: No rash Neurological system: Very confused and agitated.  No other focal signs .  All rest of the systems have been reviewed  PAST MEDICAL HISTORY: Past Medical History  Diagnosis Date  . Cancer [facility]) 2007    gallbladder  . Metastasis from gallbladder cancer ([place]) 11/05/2014    PAST SURGICAL HISTORY: Past Surgical History  Procedure Laterality Date  . Cholecystectomy  03/2006  . Hand surgery Right 2012  . Portacath placement  01/2013  . Colonoscopy  2013  . Mass excision  02-01-14    abdominal wall    FAMILY HISTORY Family History  Problem Relation Age of Onset  . Other Father     brain tumor    ADVANCED DIRECTIVES:  Patient does not have any living will or healthcare power of attorney.  Information was given .  Available resources had been discussed.  We will follow-up on subsequent appointments  regarding this issue HEALTH MAINTENANCE: Social History  Substance Use Topics  . Smoking status: Never Smoker   . Smokeless tobacco: Never Used  . Alcohol Use: No      No Known Allergies  Current Facility-Administered Medications  Medication Dose Route Frequency Provider Last Rate Last Dose  . 0.9 %  sodium chloride infusion    Intravenous Continuous Cammie Sickle, MD      . 0.9 %  sodium chloride infusion   Intravenous Continuous Forest Gleason, MD      . acetaminophen (TYLENOL) tablet 650 mg  650 mg Oral Q4H PRN Evlyn Kanner, NP      . dexamethasone (DECADRON) injection 16 mg  16 mg Intravenous Q24H Evlyn Kanner, NP   16 mg at 04/03/2015 2254  . LORazepam (ATIVAN) injection 0.5 mg  0.5 mg Intravenous Q4H PRN Colleen Can, MD      . meropenem [facility]) 500 mg in sodium chloride 0.9 % 50 mL IVPB  500 mg Intravenous Q12H Forest Gleason, MD   500 mg at 04/13/15 1338  . morphine 2 MG/ML injection 1 mg  1 mg Intravenous Q1H PRN Evlyn Kanner, NP   1 mg at 04/13/15 1338  . morphine 2 MG/ML injection 2 mg  2 mg Intravenous Q1H PRN Colleen Can, MD      . pantoprazole (PROTONIX) injection 40 mg  40 mg Intravenous QHS Evlyn Kanner, NP   40 mg at 04/23/2015 2255  . prochlorperazine (COMPAZINE) suppository 25 mg  25 mg Rectal Q8H PRN Colleen Can, MD        OBJECTIVE:     Body mass index is 27.41 kg/(m^2).    ECOG FS:1 - Symptomatic but completely ambulatory  PHYSICAL EXAM: Gen. status: Patient is extremely confused disoriented.  Does not follow any command HEENT: Mucous membranes are dry.  No jaundice. Skin: Poor skin turgor Cardiac: Tachycardia Lungs: Diminished at entry on both sides GI: Tenderness in right upper quadrant.  Bowel sounds are present. Lower extremity edema Neurological system: Higher functions patient is disoriented confused Cranial nerves are intact Upper and lower extremity power and tone difficult to assess Deep tendon reflexes are raised All other systems have been examined  LAB RESULTS:  Admission on 03/30/2015  Component Date Value Ref Range Status  . WBC 04/23/2015 29.5* 3.6 - 11.0 K/uL Final  . RBC 04/27/2015 3.32* 3.80 - 5.20 MIL/uL Final  . Hemoglobin 04/04/2015 9.3* 12.0 - 16.0 g/dL Final  . HCT 04/26/2015 27.8* 35.0 - 47.0 % Final  . MCV 04/24/2015  83.8  80.0 - 100.0 fL Final  . MCH 04/28/2015 28.0  26.0 - 34.0 pg Final  . MCHC 04/09/2015 33.4  32.0 - 36.0 g/dL Final  . RDW 04/05/2015 16.7* 11.5 - 14.5 % Final  . Platelets 04/29/2015 72* 150 - 440 K/uL Final  . Neutrophils Relative % 04/03/2015 93.000000   Final  . Lymphocytes Relative 04/11/2015 1.000000   Final  . Monocytes Relative 04/01/2015 6.000000   Final  . Eosinophils Relative 04/11/2015 0.000000   Final  . Basophils Relative 04/02/2015 0.000000   Final  . Neutro Abs 04/11/2015 27.4* 1.4 - 6.5 K/uL Final  . Lymphs Abs 04/24/2015 0.3* 1.0 - 3.6 K/uL Final  . Monocytes Absolute 04/04/2015 1.8* 0.2 - 0.9 K/uL Final  . Eosinophils Absolute 04/11/2015 0.0  0 - 0.7 K/uL Final  . Basophils Absolute 04/11/2015 0.0  0 - 0.1 K/uL Final  . RBC Morphology 04/13/2015  MIXED RBC POPULATION   Final   HYPOCHROMIC  . Sodium 04/17/2015 132* 135 - 145 mmol/L Final  . Potassium 04/01/2015 4.8  3.5 - 5.1 mmol/L Final  . Chloride 04/02/2015 99* 101 - 111 mmol/L Final  . CO2 04/28/2015 21* 22 - 32 mmol/L Final  . Glucose, Bld 04/01/2015 145* 65 - 99 mg/dL Final  . BUN 04/07/2015 100* 6 - 20 mg/dL Final  . Creatinine, Ser 04/27/2015 2.67* 0.44 - 1.00 mg/dL Final  . Calcium 04/21/2015 8.2* 8.9 - 10.3 mg/dL Final  . Total Protein 04/05/2015 7.1  6.5 - 8.1 g/dL Final  . Albumin 04/18/2015 1.9* 3.5 - 5.0 g/dL Final  . AST 04/02/2015 28  15 - 41 U/L Final  . ALT 04/01/2015 23  14 - 54 U/L Final  . Alkaline Phosphatase 04/21/2015 121  38 - 126 U/L Final  . Total Bilirubin 04/29/2015 5.1* 0.3 - 1.2 mg/dL Final  . GFR calc non Af Amer 04/08/2015 17* >60 mL/min Final  . GFR calc Af Amer 04/24/2015 20* >60 mL/min Final   Comment: (NOTE) The eGFR has been calculated using the CKD EPI equation. This calculation has not been validated in all clinical situations. eGFR's persistently <60 mL/min signify possible Chronic Kidney Disease.   . Anion gap 03/31/2015 12  5 - 15 Final  . Magnesium 04/05/2015  2.5* 1.7 - 2.4 mg/dL Final  . Specimen Description 04/22/2015 URINE, CLEAN CATCH   Final  . Special Requests 03/30/2015 NONE   Final  . Culture 04/26/2015    Final                   Value:>=100,000 COLONIES/mL GRAM NEGATIVE RODS IDENTIFICATION AND SUSCEPTIBILITIES TO FOLLOW   . Report Status 04/18/2015 PENDING   Incomplete  . Color, Urine 04/28/2015 AMBER* YELLOW Final  . APPearance 04/09/2015 CLOUDY* CLEAR Final  . Glucose, UA 04/04/2015 NEGATIVE  NEGATIVE mg/dL Final  . Bilirubin Urine 04/06/2015 1+* NEGATIVE Final  . Ketones, ur 04/20/2015 NEGATIVE  NEGATIVE mg/dL Final  . Specific Gravity, Urine 04/04/2015 1.015  1.005 - 1.030 Final  . Hgb urine dipstick 04/27/2015 NEGATIVE  NEGATIVE Final  . pH 04/10/2015 5.0  5.0 - 8.0 Final  . Protein, ur 04/27/2015 NEGATIVE  NEGATIVE mg/dL Final  . Nitrite 04/05/2015 NEGATIVE  NEGATIVE Final  . Leukocytes, UA 04/04/2015 TRACE* NEGATIVE Final  . RBC / HPF 04/26/2015 NONE SEEN  0 - 5 RBC/hpf Final  . WBC, UA 04/24/2015 0-5  0 - 5 WBC/hpf Final  . Bacteria, UA 04/26/2015 MANY* NONE SEEN Final  . Squamous Epithelial / LPF 04/16/2015 0-5* NONE SEEN Final  . WBC Clumps 04/09/2015 PRESENT   Final  . Mucous 04/23/2015 PRESENT   Final  . Hyaline Casts, UA 04/09/2015 PRESENT   Final  . Sodium 04/13/2015 133* 135 - 145 mmol/L Final  . Potassium 04/13/2015 4.6  3.5 - 5.1 mmol/L Final  . Chloride 04/13/2015 104  101 - 111 mmol/L Final  . CO2 04/13/2015 23  22 - 32 mmol/L Final  . Glucose, Bld 04/13/2015 144* 65 - 99 mg/dL Final  . BUN 04/13/2015 84* 6 - 20 mg/dL Final  . Creatinine, Ser 04/13/2015 1.90* 0.44 - 1.00 mg/dL Final  . Calcium 04/13/2015 7.5* 8.9 - 10.3 mg/dL Final  . Total Protein 04/13/2015 6.0* 6.5 - 8.1 g/dL Final  . Albumin 04/13/2015 1.5* 3.5 - 5.0 g/dL Final  . AST 04/13/2015 20  15 - 41 U/L Final  . ALT 04/13/2015 19  14 - 54 U/L Final  . Alkaline Phosphatase 04/13/2015 90  38 - 126 U/L Final  . Total Bilirubin 04/13/2015 3.5* 0.3  - 1.2 mg/dL Final  . GFR calc non Af Amer 04/13/2015 26* >60 mL/min Final  . GFR calc Af Amer 04/13/2015 30* >60 mL/min Final   Comment: (NOTE) The eGFR has been calculated using the CKD EPI equation. This calculation has not been validated in all clinical situations. eGFR's persistently <60 mL/min signify possible Chronic Kidney Disease.   . Anion gap 04/13/2015 6  5 - 15 Final  . Specimen Description 04/06/2015 BLOOD RIGHT ANTECUBITAL   Final  . Special Requests 04/10/2015 BOTTLES DRAWN AEROBIC AND ANAEROBIC 10ML   Final  . Culture  Setup Time 04/27/2015    Final                   Value:GRAM NEGATIVE RODS IN BOTH AEROBIC AND ANAEROBIC BOTTLES Organism ID to follow CRITICAL RESULT CALLED TO, READ BACK BY AND VERIFIED WITH: CHRISTINE KATSOUDAS 04/13/15 1201PM MLM   . Culture 04/04/2015    Final                   Value:GRAM NEGATIVE RODS IN BOTH AEROBIC AND ANAEROBIC BOTTLES CRITICAL RESULT CALLED TO, READ BACK BY AND VERIFIED WITH: CHRISTINE KATSOUDAS 04/13/15 1201PM MLM   . Report Status 04/11/2015 PENDING   Incomplete  . Specimen Description 04/27/2015 BLOOD LEFT ANTECUBITAL   Final  . Special Requests 04/07/2015 BOTTLES DRAWN AEROBIC AND ANAEROBIC 5ML   Final  . Culture  Setup Time 04/29/2015    Final                   Value:GRAM NEGATIVE RODS IN BOTH AEROBIC AND ANAEROBIC BOTTLES CRITICAL RESULT CALLED TO, READ BACK BY AND VERIFIED WITH: CHRISTINE KATSOUDAS 04/13/15 1201PM MLM   . Culture 04/21/2015    Final                   Value:GRAM NEGATIVE RODS IN BOTH AEROBIC AND ANAEROBIC BOTTLES CRITICAL RESULT CALLED TO, READ BACK BY AND VERIFIED WITH: CHRISTINE KATSOUDAS 04/13/15 1201PM MLM   . Report Status 04/04/2015 PENDING   Incomplete  . WBC 04/13/2015 20.2* 3.6 - 11.0 K/uL Final  . RBC 04/13/2015 2.73* 3.80 - 5.20 MIL/uL Final  . Hemoglobin 04/13/2015 7.7* 12.0 - 16.0 g/dL Final  . HCT 04/13/2015 22.9* 35.0 - 47.0 % Final  . MCV 04/13/2015 84.1  80.0 - 100.0 fL Final  .  MCH 04/13/2015 28.1  26.0 - 34.0 pg Final  . MCHC 04/13/2015 33.4  32.0 - 36.0 g/dL Final  . RDW 04/13/2015 17.1* 11.5 - 14.5 % Final  . Platelets 04/13/2015 63* 150 - 440 K/uL Final  . Neutrophils Relative % 04/13/2015 97   Final  . Lymphocytes Relative 04/13/2015 1   Final  . Monocytes Relative 04/13/2015 2   Final  . Eosinophils Relative 04/13/2015 0   Final  . Basophils Relative 04/13/2015 0   Final  . Neutro Abs 04/13/2015 19.6* 1.4 - 6.5 K/uL Final  . Lymphs Abs 04/13/2015 0.2* 1.0 - 3.6 K/uL Final  . Monocytes Absolute 04/13/2015 0.4  0.2 - 0.9 K/uL Final  . Eosinophils Absolute 04/13/2015 0.0  0 - 0.7 K/uL Final  . Basophils Absolute 04/13/2015 0.0  0 - 0.1 K/uL Final  . RBC Morphology 04/13/2015 MIXED RBC POPULATION   Final  . Smear Review 04/13/2015 PLATELETS APPEAR DECREASED   Final  .  Enterococcus species 04/25/2015 NOT DETECTED  NOT DETECTED Final  . Listeria monocytogenes 04/18/2015 NOT DETECTED  NOT DETECTED Final  . Staphylococcus species 04/04/2015 NOT DETECTED  NOT DETECTED Final  . Staphylococcus aureus 04/17/2015 NOT DETECTED  NOT DETECTED Final  . Streptococcus species 04/16/2015 NOT DETECTED  NOT DETECTED Final  . Streptococcus agalactiae 04/16/2015 NOT DETECTED  NOT DETECTED Final  . Streptococcus pneumoniae 04/14/2015 NOT DETECTED  NOT DETECTED Final  . Streptococcus pyogenes 04/04/2015 NOT DETECTED  NOT DETECTED Final  . Acinetobacter baumannii 04/24/2015 NOT DETECTED  NOT DETECTED Final  . Enterobacteriaceae species 04/02/2015 NOT DETECTED  NOT DETECTED Final  . Enterobacter cloacae complex 04/10/2015 NOT DETECTED  NOT DETECTED Final  . Escherichia coli 04/22/2015 DETECTED* NOT DETECTED Final   CRITICAL RESULT VERFIED AND CALLED TO CHRISTINE KATSOUDAS 04/13/15 1201PM MLM  . Klebsiella oxytoca 04/05/2015 NOT DETECTED  NOT DETECTED Final  . Klebsiella pneumoniae 04/26/2015 NOT DETECTED  NOT DETECTED Final  . Proteus species 04/24/2015 NOT DETECTED  NOT DETECTED  Final  . Serratia marcescens 04/03/2015 NOT DETECTED  NOT DETECTED Final  . Haemophilus influenzae 04/25/2015 NOT DETECTED  NOT DETECTED Final  . Neisseria meningitidis 04/13/2015 NOT DETECTED  NOT DETECTED Final  . Pseudomonas aeruginosa 04/23/2015 NOT DETECTED  NOT DETECTED Final  . Candida albicans 04/01/2015 NOT DETECTED  NOT DETECTED Final  . Candida glabrata 04/29/2015 NOT DETECTED  NOT DETECTED Final  . Candida krusei 04/09/2015 NOT DETECTED  NOT DETECTED Final  . Candida parapsilosis 04/11/2015 NOT DETECTED  NOT DETECTED Final  . Candida tropicalis 04/23/2015 NOT DETECTED  NOT DETECTED Final  . Carbapenem resistance 04/16/2015 NOT DETECTED  NOT DETECTED Final  . Methicillin resistance 03/31/2015 NOT DETECTED  NOT DETECTED Final  . Vancomycin resistance 04/23/2015 NOT DETECTED  NOT DETECTED Final  Appointment on 04/14/2015  Component Date Value Ref Range Status  . CA 19-9 04/03/2015 99* 0 - 35 U/mL Final   Comment: (NOTE) Roche ECLIA methodology Performed At: [facility] [place], [place] [id number] Lindon Romp MD XF:[id number]   . WBC 03/31/2015 37.6* 3.6 - 11.0 K/uL Final  . RBC 04/29/2015 3.48* 3.80 - 5.20 MIL/uL Final  . Hemoglobin 04/03/2015 9.7* 12.0 - 16.0 g/dL Final  . HCT 04/21/2015 29.1* 35.0 - 47.0 % Final  . MCV 04/27/2015 83.6  80.0 - 100.0 fL Final  . MCH 04/28/2015 27.8  26.0 - 34.0 pg Final  . MCHC 04/20/2015 33.3  32.0 - 36.0 g/dL Final  . RDW 04/21/2015 16.8* 11.5 - 14.5 % Final  . Platelets 04/20/2015 86* 150 - 440 K/uL Final  . Neutrophils Relative % 04/08/2015 77   Final  . Band Neutrophils 04/05/2015 14   Final  . Lymphocytes Relative 04/23/2015 2   Final  . Monocytes Relative 04/10/2015 5   Final  . WBC Morphology 04/16/2015 TOXIC GRANULATION   Final   VACUOLATED NEUTROPHILS  . RBC Morphology 04/17/2015 MORPHOLOGY UNREMARKABLE   Final  . Smear Review 04/10/2015 PLATELETS APPEAR DECREASED   Final  . Metamyelocytes  Relative 04/28/2015 1   Final  . Myelocytes 04/24/2015 1   Final  . Sodium 03/31/2015 128* 135 - 145 mmol/L Final  . Potassium 04/13/2015 4.6  3.5 - 5.1 mmol/L Final  . Chloride 04/06/2015 93* 101 - 111 mmol/L Final  . CO2 04/26/2015 23  22 - 32 mmol/L Final  . Glucose, Bld 03/31/2015 163* 65 - 99 mg/dL Final  . BUN 04/10/2015 97* 6 - 20 mg/dL Final  .  Creatinine, Ser 04/05/2015 2.61* 0.44 - 1.00 mg/dL Final  . Calcium 04/23/2015 8.4* 8.9 - 10.3 mg/dL Final  . Total Protein 03/31/2015 7.5  6.5 - 8.1 g/dL Final  . Albumin 04/09/2015 2.1* 3.5 - 5.0 g/dL Final  . AST 04/01/2015 37  15 - 41 U/L Final  . ALT 04/14/2015 25  14 - 54 U/L Final  . Alkaline Phosphatase 04/06/2015 131* 38 - 126 U/L Final  . Total Bilirubin 04/25/2015 4.7* 0.3 - 1.2 mg/dL Final  . GFR calc non Af Amer 04/08/2015 18* >60 mL/min Final  . GFR calc Af Amer 04/16/2015 20* >60 mL/min Final   Comment: (NOTE) The eGFR has been calculated using the CKD EPI equation. This calculation has not been validated in all clinical situations. eGFR's persistently <60 mL/min signify possible Chronic Kidney Disease.   . Anion gap 04/10/2015 12  5 - 15 Final   CA 19-9 is 80 (previously a month ago was 176)  ASSESSMENT: 1.  Altered mental status Possible bleed secondary to dehydration and acute renal failure Underlying urinary tract infection contributing to it Possibility of metastases disease to brain and progression to the brain metastases cannot be ruled out 2.  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  Increasing right upper quadrant pain possibility of recurrent disease  Plan Escherichia coli sepsis Increasing pain patient has been started on morphine drip by palliative care physician  Continue IV fluid  Change antibiotic  To  Imnipenam.  I had detailed discussion with patient's son.  Overall patient has stage IV disease and general condition has been declining  over  Time Family desires comfort care.  We will treat whatever we can corrected PT sepsis and dehydration Any patient's general condition does not improve then hospice care would be considered.  Morphine is being frequently because of pain Palliative care consult has been ordered for better pain control    Will continue supportive measures are measures of treatment of sepsis and IV fluid Recheck BUN/creatinine tomorrow And CBC Dr. Susy Manor will cover me in my absence    No matching staging information was found for the patient.  Forest Gleason, MD   04/13/2015 4:50 PM

## 2015-04-13 NOTE — Plan of Care (Signed)
Pt w/gallbladder ca w/mets to brain, bone and liver being tx for dehydration and ecoli sepsis w/fluids and iv abx - meropenem. Talked to Dr Oliva Bustard abt my concern abt high rate of fluids b/c of pt's peripheral edema and he wants to continue until tomorrow am.  Pt has been nonresponsive today and wked to get pain under control.  Now have sufficient pain meds ordered w/good frequency and pt is resting comfortably.  Had Palliative Care consult - several d/c options were discussed.  Pt's sons don't feel like their dad can handle responsibility.   Pt's decline has been fast - was able to do things like go to [place] and eat up until Sun-Mon.  Pt's family want MRI to be done tomorrow so they know extent of disease progression and can make better decisions.  Pt's HCPOA, son, discussed possibility of feeding tube, if she doesn't start to eat soon.  Tried to explain that if she is transitioning to end of life, her body won't be able to tolerate extra fluids.  But explained we should no more in next 24-48 hours.

## 2015-04-14 ENCOUNTER — Inpatient Hospital Stay: Payer: Medicare Other

## 2015-04-14 DIAGNOSIS — I82890 Acute embolism and thrombosis of other specified veins: Secondary | ICD-10-CM

## 2015-04-14 DIAGNOSIS — R0902 Hypoxemia: Secondary | ICD-10-CM | POA: Insufficient documentation

## 2015-04-14 DIAGNOSIS — R6520 Severe sepsis without septic shock: Secondary | ICD-10-CM | POA: Insufficient documentation

## 2015-04-14 DIAGNOSIS — A419 Sepsis, unspecified organism: Secondary | ICD-10-CM | POA: Insufficient documentation

## 2015-04-14 DIAGNOSIS — J9383 Other pneumothorax: Secondary | ICD-10-CM | POA: Insufficient documentation

## 2015-04-14 DIAGNOSIS — J90 Pleural effusion, not elsewhere classified: Secondary | ICD-10-CM

## 2015-04-14 DIAGNOSIS — J939 Pneumothorax, unspecified: Secondary | ICD-10-CM

## 2015-04-14 DIAGNOSIS — D696 Thrombocytopenia, unspecified: Secondary | ICD-10-CM

## 2015-04-14 DIAGNOSIS — D649 Anemia, unspecified: Secondary | ICD-10-CM

## 2015-04-14 DIAGNOSIS — B9620 Unspecified Escherichia coli [E. coli] as the cause of diseases classified elsewhere: Secondary | ICD-10-CM

## 2015-04-14 DIAGNOSIS — N390 Urinary tract infection, site not specified: Secondary | ICD-10-CM

## 2015-04-14 LAB — COMPREHENSIVE METABOLIC PANEL
ALBUMIN: 1.6 g/dL — AB (ref 3.5–5.0)
ALT: 24 U/L (ref 14–54)
ANION GAP: 3 — AB (ref 5–15)
AST: 24 U/L (ref 15–41)
Alkaline Phosphatase: 90 U/L (ref 38–126)
BILIRUBIN TOTAL: 4.6 mg/dL — AB (ref 0.3–1.2)
BUN: 73 mg/dL — ABNORMAL HIGH (ref 6–20)
CO2: 24 mmol/L (ref 22–32)
Calcium: 8.2 mg/dL — ABNORMAL LOW (ref 8.9–10.3)
Chloride: 117 mmol/L — ABNORMAL HIGH (ref 101–111)
Creatinine, Ser: 1.37 mg/dL — ABNORMAL HIGH (ref 0.44–1.00)
GFR calc Af Amer: 44 mL/min — ABNORMAL LOW (ref >60)
GFR calc non Af Amer: 38 mL/min — ABNORMAL LOW (ref >60)
GLUCOSE: 153 mg/dL — AB (ref 65–99)
POTASSIUM: 4.6 mmol/L (ref 3.5–5.1)
Sodium: 144 mmol/L (ref 135–145)
TOTAL PROTEIN: 5.8 g/dL — AB (ref 6.5–8.1)

## 2015-04-14 LAB — CBC WITH DIFFERENTIAL/PLATELET
Basophils Absolute: 0 10*3/uL (ref 0–0.1)
Basophils Relative: 0 %
Eosinophils Absolute: 0 10*3/uL (ref 0–0.7)
HCT: 23.3 % — ABNORMAL LOW (ref 35.0–47.0)
Hemoglobin: 7.7 g/dL — ABNORMAL LOW (ref 12.0–16.0)
LYMPHS ABS: 0.2 10*3/uL — AB (ref 1.0–3.6)
MCH: 27.6 pg (ref 26.0–34.0)
MCHC: 33 g/dL (ref 32.0–36.0)
MCV: 83.9 fL (ref 80.0–100.0)
MONO ABS: 0.4 10*3/uL (ref 0.2–0.9)
Neutro Abs: 18.3 10*3/uL — ABNORMAL HIGH (ref 1.4–6.5)
Neutrophils Relative %: 97 %
PLATELETS: 51 10*3/uL — AB (ref 150–440)
RBC: 2.78 MIL/uL — ABNORMAL LOW (ref 3.80–5.20)
RDW: 16.9 % — AB (ref 11.5–14.5)
WBC: 19 10*3/uL — ABNORMAL HIGH (ref 3.6–11.0)

## 2015-04-14 LAB — URINE CULTURE: Culture: >=100000

## 2015-04-14 MED ORDER — MUPIROCIN 2 % EX OINT
TOPICAL_OINTMENT | Freq: Two times a day (BID) | CUTANEOUS | Status: DC
  Administered 2015-04-14: 13:00:00 via NASAL
  Administered 2015-04-14: 1 via NASAL
  Filled 2015-04-14: qty 22

## 2015-04-14 MED ORDER — SODIUM CHLORIDE 0.9 % IV SOLN
INTRAVENOUS | Status: DC
Start: 2015-04-14 — End: 2015-04-14
  Administered 2015-04-14: 14:00:00 via INTRAVENOUS

## 2015-04-14 MED ORDER — MEROPENEM 1 G IV SOLR
1.0000 g | Freq: Two times a day (BID) | INTRAVENOUS | Status: DC
  Administered 2015-04-14 – 2015-04-15 (×2): 1 g via INTRAVENOUS
  Filled 2015-04-14 (×4): qty 1

## 2015-04-14 MED ORDER — BISACODYL 10 MG RE SUPP: 10.0000 mg | Freq: Every day | RECTAL | Status: DC | PRN

## 2015-04-14 MED ORDER — FUROSEMIDE 10 MG/ML IJ SOLN
40.0000 mg | Freq: Once | INTRAMUSCULAR | Status: AC
  Administered 2015-04-14: 40 mg via INTRAVENOUS

## 2015-04-14 MED ORDER — MORPHINE SULFATE (PF) 2 MG/ML IV SOLN
2.0000 mg | INTRAVENOUS | Status: DC | PRN
  Administered 2015-04-14 – 2015-04-15 (×6): 2 mg via INTRAVENOUS
  Filled 2015-04-14 (×6): qty 1

## 2015-04-14 MED ORDER — MORPHINE SULFATE (PF) 2 MG/ML IV SOLN
1.0000 mg | INTRAVENOUS | Status: DC | PRN
  Filled 2015-04-14: qty 1

## 2015-04-14 MED ORDER — FUROSEMIDE 10 MG/ML IJ SOLN
INTRAMUSCULAR | Status: AC
  Administered 2015-04-14: 40 mg via INTRAVENOUS
  Filled 2015-04-14: qty 4

## 2015-04-14 MED ORDER — LORAZEPAM 2 MG/ML IJ SOLN
0.5000 mg | Freq: Four times a day (QID) | INTRAMUSCULAR | Status: DC | PRN
  Administered 2015-04-14 (×2): 0.5 mg via INTRAVENOUS
  Filled 2015-04-14 (×2): qty 1

## 2015-04-14 MED ORDER — DEXAMETHASONE SODIUM PHOSPHATE 10 MG/ML IJ SOLN
10.0000 mg | INTRAMUSCULAR | Status: DC
  Administered 2015-04-14: 10 mg via INTRAVENOUS
  Filled 2015-04-14: qty 1

## 2015-04-14 MED ORDER — MORPHINE SULFATE (CONCENTRATE) 10 MG/0.5ML PO SOLN: 5.0000 mg | ORAL | Status: DC | PRN

## 2015-04-14 NOTE — Progress Notes (Signed)
Called by nursing staff for patient desaturation 79% on 4L [place] Examined at bedside - diffuse crackle, tachypnea  Stop IVF Lasix 40mg  CXR NRB 15L - SaO2 90  AVOID BIPAP given evidence of pneumothorax

## 2015-04-14 NOTE — Progress Notes (Signed)
Patient ID: Marcia Collins, female   DOB: 28-Mar-1946, 69 y.o.   MRN: [id number] [facility] PROGRESS NOTE  PCP: Otilio Miu, MD  HPI/Subjective: Patient grimaces with abdominal palpation. Received IV ativan and pain medication earlier  Objective: Filed Vitals:   04/14/15 0454 04/14/15 1304  BP: 120/63 97/63  Pulse: 96 76  Temp: 98.3 F (36.8 C) 98.1 F (36.7 C)  Resp: 20 20    Filed Weights   04/14/2015 1552  Weight: 81.738 kg (180 lb 3.2 oz)    ROS: Review of Systems  Unable to perform ROS  Exam: Physical Exam  Constitutional: She appears lethargic.  HENT:  Nose: No mucosal edema.  Mouth/Throat: No oropharyngeal exudate or posterior oropharyngeal edema.  Mouth dry  Eyes: Conjunctivae and lids are normal. Pupils are equal, round, and reactive to light.  Neck: No JVD present. Carotid bruit is not present. No edema present. No thyroid mass and no thyromegaly present.  Cardiovascular: S1 normal and S2 normal.  Exam reveals no gallop.   No murmur heard. Pulses:      Dorsalis pedis pulses are 2+ on the right side, and 2+ on the left side.  Respiratory: No respiratory distress. She has decreased breath sounds in the right lower field and the left lower field. She has no wheezes. She has no rhonchi. She has no rales.  GI: Soft. Bowel sounds are normal. There is tenderness in the right upper quadrant.  Musculoskeletal:       Right ankle: She exhibits swelling.       Left ankle: She exhibits swelling.  Lymphadenopathy:    She has no cervical adenopathy.  Neurological: She appears lethargic.  Skin: Skin is warm. No rash noted. Nails show no clubbing.  Psychiatric:  Unable to assess with altered mental status      Data Reviewed: Basic Metabolic Panel:  Recent Labs Lab 04/29/2015 1210 04/16/2015 1712 04/13/15 0515 04/14/15 0507  NA 128* 132* 133* 144  K 4.6 4.8 4.6 4.6  CL 93* 99* 104 117*  CO2 23 21* 23 24  GLUCOSE 163* 145* 144* 153*  BUN 97*  100* 84* 73*  CREATININE 2.61* 2.67* 1.90* 1.37*  CALCIUM 8.4* 8.2* 7.5* 8.2*  MG  --  2.5*  --   --    Liver Function Tests:  Recent Labs Lab 04/09/2015 1210 04/04/2015 1712 04/13/15 0515 04/14/15 0507  AST 37 28 20 24   ALT 25 23 19 24   ALKPHOS 131* 121 90 90  BILITOT 4.7* 5.1* 3.5* 4.6*  PROT 7.5 7.1 6.0* 5.8*  ALBUMIN 2.1* 1.9* 1.5* 1.6*   CBC:  Recent Labs Lab 04/19/2015 1210 04/10/2015 1712 04/13/15 0515 04/14/15 0507  WBC 37.6* 29.5* 20.2* 19.0*  NEUTROABS  --  27.4* 19.6* 18.3*  HGB 9.7* 9.3* 7.7* 7.7*  HCT 29.1* 27.8* 22.9* 23.3*  MCV 83.6 83.8 84.1 83.9  PLT 86* 72* 63* 51*    Recent Results (from the past 240 hour(s))  Urine culture     Status: None   Collection Time: 04/04/2015  5:12 PM  Result Value Ref Range Status   Specimen Description URINE, CLEAN CATCH  Final   Special Requests NONE  Final   Culture >=100,000 COLONIES/mL KLEBSIELLA OXYTOCA  Final   Report Status 04/14/2015 FINAL  Final   Organism ID, Bacteria KLEBSIELLA OXYTOCA  Final      Susceptibility   Klebsiella oxytoca - MIC*    AMPICILLIN 16 RESISTANT Resistant     CEFAZOLIN <=4 SENSITIVE  Sensitive     CEFTRIAXONE <=1 SENSITIVE Sensitive     CIPROFLOXACIN <=0.25 SENSITIVE Sensitive     GENTAMICIN <=1 SENSITIVE Sensitive     IMIPENEM <=0.25 SENSITIVE Sensitive     NITROFURANTOIN <=16 SENSITIVE Sensitive     TRIMETH/SULFA <=20 SENSITIVE Sensitive     PIP/TAZO Value in next row Sensitive      SENSITIVE<=4    LEVOFLOXACIN Value in next row Sensitive      SENSITIVE<=0.12    * >=100,000 COLONIES/mL KLEBSIELLA OXYTOCA  Culture, blood (routine x 2)     Status: None (Preliminary result)   Collection Time: 04/25/2015  9:31 PM  Result Value Ref Range Status   Specimen Description BLOOD RIGHT ANTECUBITAL  Final   Special Requests BOTTLES DRAWN AEROBIC AND ANAEROBIC 10ML  Final   Culture  Setup Time   Final    GRAM NEGATIVE RODS IN BOTH AEROBIC AND ANAEROBIC BOTTLES CRITICAL RESULT CALLED TO, READ  BACK BY AND VERIFIED WITH: CHRISTINE KATSOUDAS 04/13/15 1201PM MLM    Culture   Final    ESCHERICHIA COLI IN BOTH AEROBIC AND ANAEROBIC BOTTLES SUSCEPTIBILITIES TO FOLLOW    Report Status PENDING  Incomplete  Culture, blood (routine x 2)     Status: None (Preliminary result)   Collection Time: 04/21/2015  9:31 PM  Result Value Ref Range Status   Specimen Description BLOOD LEFT ANTECUBITAL  Final   Special Requests BOTTLES DRAWN AEROBIC AND ANAEROBIC 5ML  Final   Culture  Setup Time   Final    GRAM NEGATIVE RODS IN BOTH AEROBIC AND ANAEROBIC BOTTLES CRITICAL VALUE NOTED.  VALUE IS CONSISTENT WITH PREVIOUSLY REPORTED AND CALLED VALUE.    Culture   Final    ESCHERICHIA COLI IN BOTH AEROBIC AND ANAEROBIC BOTTLES SUSCEPTIBILITIES TO FOLLOW    Report Status PENDING  Incomplete  Blood Culture ID Panel (Reflexed)     Status: Abnormal   Collection Time: 04/02/2015  9:31 PM  Result Value Ref Range Status   Enterococcus species NOT DETECTED NOT DETECTED Final   Listeria monocytogenes NOT DETECTED NOT DETECTED Final   Staphylococcus species NOT DETECTED NOT DETECTED Final   Staphylococcus aureus NOT DETECTED NOT DETECTED Final   Streptococcus species NOT DETECTED NOT DETECTED Final   Streptococcus agalactiae NOT DETECTED NOT DETECTED Final   Streptococcus pneumoniae NOT DETECTED NOT DETECTED Final   Streptococcus pyogenes NOT DETECTED NOT DETECTED Final   Acinetobacter baumannii NOT DETECTED NOT DETECTED Final   Enterobacteriaceae species NOT DETECTED NOT DETECTED Final   Enterobacter cloacae complex NOT DETECTED NOT DETECTED Final   Escherichia coli DETECTED (A) NOT DETECTED Final    Comment: CRITICAL RESULT VERFIED AND CALLED TO CHRISTINE KATSOUDAS 04/13/15 1201PM MLM   Klebsiella oxytoca NOT DETECTED NOT DETECTED Final   Klebsiella pneumoniae NOT DETECTED NOT DETECTED Final   Proteus species NOT DETECTED NOT DETECTED Final   Serratia marcescens NOT DETECTED NOT DETECTED Final    Haemophilus influenzae NOT DETECTED NOT DETECTED Final   Neisseria meningitidis NOT DETECTED NOT DETECTED Final   Pseudomonas aeruginosa NOT DETECTED NOT DETECTED Final   Candida albicans NOT DETECTED NOT DETECTED Final   Candida glabrata NOT DETECTED NOT DETECTED Final   Candida krusei NOT DETECTED NOT DETECTED Final   Candida parapsilosis NOT DETECTED NOT DETECTED Final   Candida tropicalis NOT DETECTED NOT DETECTED Final   Carbapenem resistance NOT DETECTED NOT DETECTED Final   Methicillin resistance NOT DETECTED NOT DETECTED Final   Vancomycin resistance NOT DETECTED NOT DETECTED  Final     Studies: Dg Chest 2 View  04/13/2015  CLINICAL DATA:  Hypoxia. EXAM: CHEST  2 VIEW COMPARISON:  PET-CT scan 01/2015.  Chest radiograph 01/08/2013. FINDINGS: There is a central venous catheter with tip over the cavoatrial junction region. There is a small Right apical pneumothorax. This extends to about the level of the fourth rib, measuring about 3.1 cm at the apex. There is diffuse infiltration and volume loss in the right lung. Small right pleural effusion. Bilateral perihilar infiltrates may represent edema. Mild cardiac enlargement. Degenerative changes in the shoulders. IMPRESSION: Small right apical pneumothorax. Atelectasis in the right lung base. Small right pleural effusion. Bilateral perihilar infiltration suggesting edema or pneumonia. These results were called by telephone at the time of interpretation on 04/13/2015 at 9:30 pm to Dr. Myriam Jacobson, the patient's nurse on oncology 1C, who verbally acknowledged these results. Electronically Signed   By: Lucienne Capers M.D.   On: 04/13/2015 21:33   Ct Head Wo Contrast  04/13/2015  CLINICAL DATA:  Altered mental status. Hypoxia. Nonresponsive today. EXAM: CT HEAD WITHOUT CONTRAST TECHNIQUE: Contiguous axial images were obtained from the base of the skull through the vertex without intravenous contrast. COMPARISON:  MRI brain 08/07/2014.  CT head  08/06/2014. FINDINGS: The mostly cystic mass demonstrated in the left posterior periventricular region and extending into the posterior corpus callosum across the midline. This is likely represent primary or metastatic disease. The lesion is smaller than on previous study and previous areas of increased density have significantly decreased. The lesion today measures about 2.5 x 3.8 cm. There is only a small residual area of increased density adjacent to the left lateral ventricle measuring about 4 mm. This could be related to the tumor may indicate an area of hemorrhage. It appears to been present previously. There is mild white matter edema in the left parietal region. No significant midline shift. There is displacement of the left lateral ventricle with mild dilatation of the posterior horn of the left lateral ventricle. No additional intracranial lesions are identified. Mild diffuse atrophy. Low-attenuation changes in the deep white matter consistent with small vessel ischemia. No abnormal extra-axial fluid collections. Gray-white matter junctions are distinct. Basal cisterns are not effaced. Postoperative changes with left posterior parietal craniotomy and mesh closure. No depressed skull fractures. Visualized paranasal sinuses are not opacified. IMPRESSION: Decreased size of the cystic mass in the left posterior periventricular region. Small focal area of increased density could represent calcification or hemorrhage. Areas of increased density are decreased since previous study and likely related to the known tumor. White matter edema in the left parietal lobe. No definite evidence of any acute changes. Electronically Signed   By: Lucienne Capers M.D.   On: 04/13/2015 21:47   US Abdomen Complete  04/14/2015  CLINICAL DATA:  One week of right-sided abdominal pain, history of gallbladder malignancy status post cholecystectomy; the patient is unable to cooperate with breath hold techniques or to move the side  side and is in considerable pain EXAM: ULTRASOUND ABDOMEN COMPLETE COMPARISON:  PET-CT study of February 25, 2015 FINDINGS: Gallbladder: The gallbladder is surgically absent Common bile duct: Diameter: 2 mm Liver: In the right hepatic lobe there is a 1.3 x 1 x 1.4 cm hypoechoic structure that is not clearly a cyst. There is gas in the biliary tree. The left lobe hepatic ducts are mildly prominent. IVC: Within the inferior vena cava there are echoes there is echogenic material worrisome for either bland or tumor thrombus. This  is not completely obstructing the IVC. Pancreas: Bowel gas and the patient's clinical condition limits evaluation of the pancreatic tail. The pancreatic head and body are grossly normal where visualized. Spleen: Size and appearance within normal limits. Right Kidney: Length: 10.9 cm. Echogenicity within normal limits. No mass or hydronephrosis visualized. Left Kidney: Length: 11.0 cm. Echogenicity within normal limits. No mass or hydronephrosis visualized. Abdominal aorta: No aortic aneurysm is demonstrated. The iliac arteries were obscured by bowel gas. Other findings: No ascites is demonstrated. There is a right pleural effusion containing echogenic material. IMPRESSION: 1. Thrombus either bland or tumor in nature is present in the IVC. 2. Hypoechoic mass in the right hepatic lobe measuring 1.4 cm in greatest dimension. There is air in the biliary tree which not unexpected post cholecystectomy and sphincterotomy. There is mild prominence of the left lobe hepatic ducts. 3. There is a right pleural effusion containing debris. 4. The kidneys, abdominal aorta, and spleen are unremarkable. Electronically Signed   By: David  Martinique M.D.   On: 04/14/2015 10:12    Scheduled Meds: . [START ON 2015-04-17] dexamethasone  10 mg Intravenous Q24H  . meropenem (MERREM) IV  1 g Intravenous Q12H  . mupirocin ointment   Nasal BID  . pantoprazole  40 mg Intravenous QHS   Continuous Infusions: . sodium  chloride      Assessment/Plan: Active Problems:   Primary cholangiocarcinoma of bile duct (HCC)   Pressure ulcer   Severe sepsis ([place])   1. Severe sepsis, leukocytosis, Hypotension . Patient is on meropenem. Klebsiella in urine culture done previously. Escherichia coli in blood culture. 2.  acute respiratory failure with hypoxia. Chest x-ray showing possible apical pneumothorax. Continue oxygen supplementation 3. Acute encephalopathy- patient was given medications today. Unable to stay still for an MRI of the brain. Right now I don't think she can tolerate food. 4. Acute renal failure likely ATN from sepsis 5. 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 Ultrasound showing liver mass and IVC thrombus. Patient is on IV Decadron. Palliative care consultation appreciated. Patient is a DO NOT RESUSCITATE. Anemia and thrombocytopenia likely secondary to cancerous process.   Code Status:     Code Status Orders        Start     Ordered   04/24/2015 1543  Do not attempt resuscitation (DNR)   Continuous    Question Answer Comment  In the event of cardiac or respiratory ARREST Do not call a "code blue"   In the event of cardiac or respiratory ARREST Do not perform Intubation, CPR, defibrillation or ACLS   In the event of cardiac or respiratory ARREST Use medication by any route, position, wound care, and other measures to relive pain and suffering. May use oxygen, suction and manual treatment of airway obstruction as needed for comfort.      04/26/2015 1542    Advance Directive Documentation        Most Recent Value   Type of Advance Directive  Living will, Healthcare Power of Attorney   Pre-existing out of facility DNR order (yellow form or pink MOST form)     "MOST" Form in Place?       Family Communication: Case discussed with 3 sons at the bedside  Disposition Plan: Likely candidate for hospice home or home with hospice depending on how she does through the weekend.    Consultants:  Palliative care    oncology  Antibiotics:  Meropenem  Time spent: 25 minutes  Loletha Grayer  [facility]

## 2015-04-14 NOTE — Progress Notes (Signed)
Paged and spoke with Dr. Lavetta Nielsen about results called in by Dr. Gerilyn Nestle the radiologist about pt chest xray. Pt had a 3cm right pneumothorax and bilateral infiltrates possibily from edema. No new orders were received. Will continue to monitor./

## 2015-04-14 NOTE — Care Management Important Message (Signed)
Important Message  Patient Details  Name: Marcia Collins MRN: [id number] Date of Birth: [birth year]   Medicare Important Message Given:  Yes    Marcia Collins 04/14/2015, 9:49 AM

## 2015-04-14 NOTE — Consult Note (Signed)
[facility] CONSULT NOTE    Date: 04/14/2015                  Patient Name:  Marcia Collins  MRN: [id number]  DOB: [birth year]  Age / Sex: 69 y.o., female         PCP: Otilio Miu, MD                 Service Requesting Consult: Dr. Oliva Bustard                 Reason for Consult: Acute renal failure            History of Present Illness: Patient is a 69 y.o. 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   Patient was unable to offer any history however her family members were at the bedside.  Patient has developed worsening mental status over the past week.  She's had rather poor by mouth intake over the past 2 weeks. She is now found to have sepsis with gram-negative rods in the blood.  Baseline creatinine is 0.79.  Upon admission creatinine was found to be 2.61 and peaked at 2.67.  Creatinine is currently down to 1.37 with a BUN of 73.  Abdominal ultrasound was reviewed and right kidney was 10.9 cm and left kidney was 11 cm without hydronephrosis.  Patient has been on IV fluid hydration with 0.9 normal saline.   Medications: Outpatient medications: Prescriptions prior to admission  Medication Sig Dispense Refill Last Dose  . citalopram (CELEXA) 20 MG tablet Take 1 tablet (20 mg total) by mouth daily. 30 tablet 3 Taking  . lisinopril (PRINIVIL,ZESTRIL) 20 MG tablet Take 1 tablet (20 mg total) by mouth daily. 30 tablet 3 Taking  . predniSONE (DELTASONE) 20 MG tablet Take 1 tablet (20 mg total) by mouth daily with breakfast. 30 tablet 0 Taking  . traMADol (ULTRAM) 50 MG tablet Take 1 tablet (50 mg total) by mouth daily. 30 tablet 1 Taking    Current medications: Current Facility-Administered Medications  Medication Dose Route Frequency Provider Last Rate Last Dose  . 0.9 %  sodium chloride infusion    Intravenous Continuous Loletha Grayer, MD 30 mL/hr at 04/14/15 1423    . acetaminophen (TYLENOL) tablet 650 mg  650 mg Oral Q4H PRN Evlyn Kanner, NP      . bisacodyl (DULCOLAX) suppository 10 mg  10 mg Rectal Daily PRN Colleen Can, MD      . Derrill Memo ON 2015-04-27] dexamethasone (DECADRON) injection 10 mg  10 mg Intravenous Q24H Forest Gleason, MD      . LORazepam (ATIVAN) injection 0.5 mg  0.5 mg Intravenous Q6H PRN Colleen Can, MD   0.5 mg at 04/14/15 1220  . meropenem (MERREM) 1 g in sodium chloride 0.9 % 100 mL IVPB  1 g Intravenous Q12H Forest Gleason, MD   1 g at 04/14/15 1226  . morphine 2 MG/ML injection 1 mg  1 mg Intravenous Q1H PRN Colleen Can, MD      . morphine 2 MG/ML injection 2 mg  2 mg Intravenous Q1H PRN Colleen Can, MD   2 mg at 04/14/15 1423  . morphine CONCENTRATE 10 MG/0.5ML oral solution 5 mg  5 mg Oral Q4H PRN Colleen Can, MD      . mupirocin  ointment (BACTROBAN) 2 %   Nasal BID Colleen Can, MD      . ondansetron [facility]) injection 4 mg  4 mg Intravenous Q6H PRN Forest Gleason, MD      . pantoprazole (PROTONIX) injection 40 mg  40 mg Intravenous QHS Evlyn Kanner, NP   40 mg at 04/13/15 2354      Allergies: No Known Allergies    Past Medical History: Past Medical History  Diagnosis Date  . Cancer [facility]) 2007    gallbladder  . 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 Cholecystectomy  03/2006  . Hand surgery Right 2012  . Portacath placement  01/2013  . Colonoscopy  2013  . Mass excision  02-01-14    abdominal wall     Family History: Family History  Problem Relation Age of Onset  . Other Father     brain tumor     Social History: Social History   Social History  . Marital Status: Married    Spouse Name: N/A  . Number of Children: N/A  . Years of Education: N/A   Occupational History  . Not on file.    Social History Main Topics  . Smoking status: Never Smoker   . Smokeless tobacco: Never Used  . Alcohol Use: No  . Drug Use: No  . Sexual Activity: Not on file   Other Topics Concern  . Not on file   Social History Narrative     Review of Systems: As per HPI  Vital Signs: Blood pressure 97/63, pulse 76, temperature 98.1 F (36.7 C), temperature source Oral, resp. rate 20, height 5\' 8"  (1.727 m), weight 81.738 kg (180 lb 3.2 oz), SpO2 94 %.  Weight trends: Filed Weights   04/08/2015 1552  Weight: 81.738 kg (180 lb 3.2 oz)    Physical Exam: General: Cachetic, no acute distress  Head: Normocephalic, atraumatic.  Eyes: Anicteric, EOMI  Nose: Mucous membranes dry, not inflammed, nonerythematous.  Throat: Oral mucosa very dry.   Neck: Supple, trachea midline.  Lungs:  Normal respiratory effort. Clear to auscultation BL without crackles or wheezes.  Heart: RRR. S1 and S2 normal without gallop, murmur, or rubs.  Abdomen:  BS normoactive. Soft, Nondistended, non-tender.  No masses or organomegaly.  Extremities: No pretibial edema.  Neurologic: Lethargic but arousable, will follow simple commands  Skin: No visible rashes, scars.    Lab results: Basic Metabolic Panel:  Recent Labs Lab 04/20/2015 1712 04/13/15 0515 04/14/15 0507  NA 132* 133* 144  K 4.8 4.6 4.6  CL 99* 104 117*  CO2 21* 23 24  GLUCOSE 145* 144* 153*  BUN 100* 84* 73*  CREATININE 2.67* 1.90* 1.37*  CALCIUM 8.2* 7.5* 8.2*  MG 2.5*  --   --     Liver Function Tests:  Recent Labs Lab 03/30/2015 1712 04/13/15 0515 04/14/15 0507  AST 28 20 24   ALT 23 19 24   ALKPHOS 121 90 90  BILITOT 5.1* 3.5* 4.6*  PROT 7.1 6.0* 5.8*  ALBUMIN 1.9* 1.5* 1.6*   No results for input(s): LIPASE, AMYLASE in the last 168 hours. No results for input(s): AMMONIA in the last 168 hours.  CBC:  Recent Labs Lab 04/03/2015 1210 04/17/2015 1712 04/13/15 0515 04/14/15 0507  WBC 37.6* 29.5* 20.2* 19.0*  NEUTROABS  --   27.4* 19.6* 18.3*  HGB 9.7* 9.3* 7.7* 7.7*  HCT 29.1* 27.8* 22.9* 23.3*  MCV 83.6 83.8 84.1 83.9  PLT 86* 72* 63* 51*    Cardiac Enzymes: No results for input(s): CKTOTAL, CKMB, CKMBINDEX, TROPONINI in the last 168 hours.  BNP: Invalid input(s): POCBNP  CBG: No results for input(s): GLUCAP in the last 168 hours.  Microbiology: Results for orders placed or performed during the hospital encounter of 03/30/2015  Urine culture     Status: None   Collection Time: 04/14/2015  5:12 PM  Result Value Ref Range Status   Specimen Description URINE, CLEAN CATCH  Final   Special Requests NONE  Final   Culture >=100,000 COLONIES/mL KLEBSIELLA OXYTOCA  Final   Report Status 04/14/2015 FINAL  Final   Organism ID, Bacteria KLEBSIELLA OXYTOCA  Final      Susceptibility   Klebsiella oxytoca - MIC*    AMPICILLIN 16 RESISTANT Resistant     CEFAZOLIN <=4 SENSITIVE Sensitive     CEFTRIAXONE <=1 SENSITIVE Sensitive     CIPROFLOXACIN <=0.25 SENSITIVE Sensitive     GENTAMICIN <=1 SENSITIVE Sensitive     IMIPENEM <=0.25 SENSITIVE Sensitive     NITROFURANTOIN <=16 SENSITIVE Sensitive     TRIMETH/SULFA <=20 SENSITIVE Sensitive     PIP/TAZO Value in next row Sensitive      SENSITIVE<=4    LEVOFLOXACIN Value in next row Sensitive      SENSITIVE<=0.12    * >=100,000 COLONIES/mL KLEBSIELLA OXYTOCA  Culture, blood (routine x 2)     Status: None (Preliminary result)   Collection Time: 04/18/2015  9:31 PM  Result Value Ref Range Status   Specimen Description BLOOD RIGHT ANTECUBITAL  Final   Special Requests BOTTLES DRAWN AEROBIC AND ANAEROBIC 10ML  Final   Culture  Setup Time   Final    GRAM NEGATIVE RODS IN BOTH AEROBIC AND ANAEROBIC BOTTLES CRITICAL RESULT CALLED TO, READ BACK BY AND VERIFIED WITH: CHRISTINE KATSOUDAS 04/13/15 1201PM MLM    Culture   Final    ESCHERICHIA COLI IN BOTH AEROBIC AND ANAEROBIC BOTTLES SUSCEPTIBILITIES TO FOLLOW    Report Status PENDING  Incomplete  Culture, blood  (routine x 2)     Status: None (Preliminary result)   Collection Time: 04/26/2015  9:31 PM  Result Value Ref Range Status   Specimen Description BLOOD LEFT ANTECUBITAL  Final   Special Requests BOTTLES DRAWN AEROBIC AND ANAEROBIC 5ML  Final   Culture  Setup Time   Final    GRAM NEGATIVE RODS IN BOTH AEROBIC AND ANAEROBIC BOTTLES CRITICAL VALUE NOTED.  VALUE IS CONSISTENT WITH PREVIOUSLY REPORTED AND CALLED VALUE.    Culture   Final    ESCHERICHIA COLI IN BOTH AEROBIC AND ANAEROBIC BOTTLES SUSCEPTIBILITIES TO FOLLOW    Report Status PENDING  Incomplete  Blood Culture ID Panel (Reflexed)     Status: Abnormal   Collection Time: 04/11/2015  9:31 PM  Result Value Ref Range Status   Enterococcus species NOT DETECTED NOT DETECTED Final   Listeria monocytogenes NOT DETECTED NOT DETECTED Final   Staphylococcus species NOT DETECTED NOT DETECTED Final   Staphylococcus aureus NOT DETECTED NOT DETECTED Final   Streptococcus species NOT DETECTED NOT DETECTED Final   Streptococcus agalactiae NOT DETECTED NOT DETECTED Final   Streptococcus pneumoniae NOT DETECTED NOT DETECTED Final   Streptococcus pyogenes NOT DETECTED NOT DETECTED Final   Acinetobacter baumannii NOT DETECTED NOT DETECTED Final   Enterobacteriaceae species NOT DETECTED NOT DETECTED Final   Enterobacter cloacae complex NOT DETECTED NOT DETECTED Final   Escherichia coli DETECTED (A) NOT DETECTED  Final    Comment: CRITICAL RESULT VERFIED AND CALLED TO CHRISTINE KATSOUDAS 04/13/15 1201PM MLM   Klebsiella oxytoca NOT DETECTED NOT DETECTED Final   Klebsiella pneumoniae NOT DETECTED NOT DETECTED Final   Proteus species NOT DETECTED NOT DETECTED Final   Serratia marcescens NOT DETECTED NOT DETECTED Final   Haemophilus influenzae NOT DETECTED NOT DETECTED Final   Neisseria meningitidis NOT DETECTED NOT DETECTED Final   Pseudomonas aeruginosa NOT DETECTED NOT DETECTED Final   Candida albicans NOT DETECTED NOT DETECTED Final   Candida  glabrata NOT DETECTED NOT DETECTED Final   Candida krusei NOT DETECTED NOT DETECTED Final   Candida parapsilosis NOT DETECTED NOT DETECTED Final   Candida tropicalis NOT DETECTED NOT DETECTED Final   Carbapenem resistance NOT DETECTED NOT DETECTED Final   Methicillin resistance NOT DETECTED NOT DETECTED Final   Vancomycin resistance NOT DETECTED NOT DETECTED Final    Coagulation Studies: No results for input(s): LABPROT, INR in the last 72 hours.  Urinalysis:  Recent Labs  04/27/2015 1712  COLORURINE AMBER*  LABSPEC 1.015  PHURINE 5.0  GLUCOSEU NEGATIVE  HGBUR NEGATIVE  BILIRUBINUR 1+*  KETONESUR NEGATIVE  PROTEINUR NEGATIVE  NITRITE NEGATIVE  LEUKOCYTESUR TRACE*      Imaging: Dg Chest 2 View  04/13/2015  CLINICAL DATA:  Hypoxia. EXAM: CHEST  2 VIEW COMPARISON:  PET-CT scan 01/2015.  Chest radiograph 01/08/2013. FINDINGS: There is a central venous catheter with tip over the cavoatrial junction region. There is a small Right apical pneumothorax. This extends to about the level of the fourth rib, measuring about 3.1 cm at the apex. There is diffuse infiltration and volume loss in the right lung. Small right pleural effusion. Bilateral perihilar infiltrates may represent edema. Mild cardiac enlargement. Degenerative changes in the shoulders. IMPRESSION: Small right apical pneumothorax. Atelectasis in the right lung base. Small right pleural effusion. Bilateral perihilar infiltration suggesting edema or pneumonia. These results were called by telephone at the time of interpretation on 04/13/2015 at 9:30 pm to Dr. Myriam Jacobson, the patient's nurse on oncology 1C, who verbally acknowledged these results. Electronically Signed   By: Lucienne Capers M.D.   On: 04/13/2015 21:33   Ct Head Wo Contrast  04/13/2015  CLINICAL DATA:  Altered mental status. Hypoxia. Nonresponsive today. EXAM: CT HEAD WITHOUT CONTRAST TECHNIQUE: Contiguous axial images were obtained from the base of the skull through the  vertex without intravenous contrast. COMPARISON:  MRI brain 08/07/2014.  CT head 08/06/2014. FINDINGS: The mostly cystic mass demonstrated in the left posterior periventricular region and extending into the posterior corpus callosum across the midline. This is likely represent primary or metastatic disease. The lesion is smaller than on previous study and previous areas of increased density have significantly decreased. The lesion today measures about 2.5 x 3.8 cm. There is only a small residual area of increased density adjacent to the left lateral ventricle measuring about 4 mm. This could be related to the tumor may indicate an area of hemorrhage. It appears to been present previously. There is mild white matter edema in the left parietal region. No significant midline shift. There is displacement of the left lateral ventricle with mild dilatation of the posterior horn of the left lateral ventricle. No additional intracranial lesions are identified. Mild diffuse atrophy. Low-attenuation changes in the deep white matter consistent with small vessel ischemia. No abnormal extra-axial fluid collections. Gray-white matter junctions are distinct. Basal cisterns are not effaced. Postoperative changes with left posterior parietal craniotomy and mesh closure. No depressed skull  fractures. Visualized paranasal sinuses are not opacified. IMPRESSION: Decreased size of the cystic mass in the left posterior periventricular region. Small focal area of increased density could represent calcification or hemorrhage. Areas of increased density are decreased since previous study and likely related to the known tumor. White matter edema in the left parietal lobe. No definite evidence of any acute changes. Electronically Signed   By: Lucienne Capers M.D.   On: 04/13/2015 21:47   US Abdomen Complete  04/14/2015  CLINICAL DATA:  One week of right-sided abdominal pain, history of gallbladder malignancy status post cholecystectomy;  the patient is unable to cooperate with breath hold techniques or to move the side side and is in considerable pain EXAM: ULTRASOUND ABDOMEN COMPLETE COMPARISON:  PET-CT study of February 25, 2015 FINDINGS: Gallbladder: The gallbladder is surgically absent Common bile duct: Diameter: 2 mm Liver: In the right hepatic lobe there is a 1.3 x 1 x 1.4 cm hypoechoic structure that is not clearly a cyst. There is gas in the biliary tree. The left lobe hepatic ducts are mildly prominent. IVC: Within the inferior vena cava there are echoes there is echogenic material worrisome for either bland or tumor thrombus. This is not completely obstructing the IVC. Pancreas: Bowel gas and the patient's clinical condition limits evaluation of the pancreatic tail. The pancreatic head and body are grossly normal where visualized. Spleen: Size and appearance within normal limits. Right Kidney: Length: 10.9 cm. Echogenicity within normal limits. No mass or hydronephrosis visualized. Left Kidney: Length: 11.0 cm. Echogenicity within normal limits. No mass or hydronephrosis visualized. Abdominal aorta: No aortic aneurysm is demonstrated. The iliac arteries were obscured by bowel gas. Other findings: No ascites is demonstrated. There is a right pleural effusion containing echogenic material. IMPRESSION: 1. Thrombus either bland or tumor in nature is present in the IVC. 2. Hypoechoic mass in the right hepatic lobe measuring 1.4 cm in greatest dimension. There is air in the biliary tree which not unexpected post cholecystectomy and sphincterotomy. There is mild prominence of the left lobe hepatic ducts. 3. There is a right pleural effusion containing debris. 4. The kidneys, abdominal aorta, and spleen are unremarkable. Electronically Signed   By: David  Martinique M.D.   On: 04/14/2015 10:12      Assessment & Plan: Pt is a 69 y.o. 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   1.  Acute renal failure due to ATN. 2.  Klebsiella UTI. 3.  Anemia unspecified.  Plan:  Patient appears to have acute renal failure resulting from poor by mouth intake and subsequent development of urinary tract infection and dehydration.  Renal function does appear to be improving with IV fluid hydration.  We will increase to 0.9 normal saline to 75 cc per hour.  Continue treatment of Klebsiella with meropenem for now.  She is also found to be significantly anemic.  Consider transfusion for hemoglobin of 7 or less.  Abdominal ultrasound was reviewed and was negative for hydronephrosis.  No urgent indication for dialysis at the moment.  Overall prognosis however appears to be quite guarded given her underlying extensive stage IV cholangiocarcinoma.  Further plan as patient progresses.

## 2015-04-14 NOTE — Progress Notes (Addendum)
Palliative Medicine Inpatient Consult Follow Up Note   Name: Marcia Collins Date: 04/14/2015 MRN: [id number]  DOB: Feb 24, 1946  Referring Physician: Forest Gleason, MD  Palliative Care consult requested for this 69 y.o. female for goals of medical therapy in patient with   TODAY'S DISCUSSIONS AND DECISIONS: 1.  DNR is continued.  2.  I have been asked to increase pain mgmt slightly --and have done so.  This was done yesterday also by me (but was [facility] in the evening).  I have confirmed that pt needs a slight increase and also have talked with sons who are all in agreement that she needs this increase in prn morphine.    3.  She has a scab under one nostril.  Will try to have nasal prong just in the other nostril.  Will order protective ointment.    4.  Family continues to be hopeful that this downturn in her functional status over the last couple of weeks and the increase in her pain complaints is related to infection instead of worsening spread of her cancer.   Without imaging, we cannot know. She was not able to tolerate MRI of brain this am.    5.  Ultrasound result in very concerning, however.  Results show:  ---Thrombus either bland or tumor in nature is present in the IVC. (does not completely obstruct IVC) --- Hypoechoic mass in the right hepatic lobe measuring 1.4 cm in greatest dimension. There is air in the biliary tree which not unexpected post cholecystectomy and sphincterotomy. There is mild prominence of the left lobe hepatic ducts. --- There is a right pleural effusion containing debris.  6.  CXR showing 3 cm Pneumothorax and bilateral infiltrates from edema (04/23/15) IV fluids held  --kidney function is improved with Cr 1.37 (was 2.61).   . 7.  I will plan on meeting with the sons on Monday afternoon to see where things stand with pt.    -------------------------------------------------------  IMPRESSION: 1. Cholangiocarcinoma (gallbladder cancer) ---Diagnosed in  2008 ---resection of part of duodenum and liver resection in past ([facility]) ---was staged originally T1, N0, M0 stage 1 gallbladder carcinoma --now stage 4 ---then noted to have liver mets and had chemo  ---mets to brain diagnosed in May of 2016 and proven by biopsy at [facility] and then had XRT 2. Sepsis ---ecoli in 4/4 bld cx bottles ---GNR in urine cx so far 3. Acute renal failure 4. Anemia  5. Leukocytosis 6. Thrombocytopenia 7. Weakness 8. Anorexia 9. Peripheral Neuropathy   _______________________________________  REVIEW OF SYSTEMS:  Patient is not able to provide ROS due to critical illness and lethargy.   CODE STATUS: DNR   PAST MEDICAL HISTORY: Past Medical History  Diagnosis Date  . Cancer [facility]) 2007    gallbladder  . 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 Cholecystectomy  03/2006  . Hand surgery Right 2012  . Portacath placement  01/2013  . Colonoscopy  2013  . Mass excision  02-01-14    abdominal wall    Vital Signs: BP 120/63 mmHg  Pulse 96  Temp(Src) 98.3 F (36.8 C) (Axillary)  Resp 20  Ht 5\' 8"  (1.727 m)  Wt 81.738 kg (180 lb 3.2 oz)  BMI 27.41 kg/m2  SpO2 90% Filed Weights   04/24/2015 1552  Weight: 81.738 kg (180 lb 3.2 oz)    Estimated body mass index is 27.41 kg/(m^2) as calculated from the  following:   Height as of this encounter: 5\' 8"  (1.727 m).   Weight as of this encounter: 81.738 kg (180 lb 3.2 oz).  PHYSICAL EXAM: Lethargic still Eyes closed She is moving some today She was verbal earlier (not making much sense though per sons--"not yet") EOMI Hrt rrr no m Lungs decreased BS bases Abd soft and NT Skin no mottling or cyanosis  LABS: CBC:    Component Value Date/Time   WBC 19.0* 04/14/2015 0507   WBC 11.0 08/08/2014 0552   HGB 7.7* 04/14/2015 0507   HGB 12.3 08/08/2014 0552   HCT 23.3* 04/14/2015 0507   HCT 37.4 08/08/2014  0552   PLT 51* 04/14/2015 0507   PLT 146* 08/08/2014 0552   MCV 83.9 04/14/2015 0507   MCV 90 08/08/2014 0552   NEUTROABS 18.3* 04/14/2015 0507   NEUTROABS 10.1* 08/08/2014 0552   LYMPHSABS 0.2* 04/14/2015 0507   LYMPHSABS 0.5* 08/08/2014 0552   MONOABS 0.4 04/14/2015 0507   MONOABS 0.4 08/08/2014 0552   EOSABS 0.0 04/14/2015 0507   EOSABS 0.0 08/08/2014 0552   BASOSABS 0.0 04/14/2015 0507   BASOSABS 0.0 08/08/2014 0552   Comprehensive Metabolic Panel:    Component Value Date/Time   NA 144 04/14/2015 0507   NA 140 08/08/2014 0552   K 4.6 04/14/2015 0507   K 3.9 08/08/2014 0552   CL 117* 04/14/2015 0507   CL 106 08/08/2014 0552   CO2 24 04/14/2015 0507   CO2 26 08/08/2014 0552   BUN 73* 04/14/2015 0507   BUN 25* 08/08/2014 0552   CREATININE 1.37* 04/14/2015 0507   CREATININE 0.72 08/08/2014 0552   GLUCOSE 153* 04/14/2015 0507   GLUCOSE 123* 08/08/2014 0552   CALCIUM 8.2* 04/14/2015 0507   CALCIUM 9.4 08/08/2014 0552   AST 24 04/14/2015 0507   AST 29 08/06/2014 1736   ALT 24 04/14/2015 0507   ALT 25 08/06/2014 1736   ALKPHOS 90 04/14/2015 0507   ALKPHOS 75 08/06/2014 1736   BILITOT 4.6* 04/14/2015 0507   BILITOT 0.6 08/06/2014 1736   PROT 5.8* 04/14/2015 0507   PROT 7.7 08/06/2014 1736   ALBUMIN 1.6* 04/14/2015 0507   ALBUMIN 4.3 08/06/2014 1736     More than 50% of the visit was spent in counseling/coordination of care: YES  Time Spent:  65 min  Addendum:  Pt will not keep nasal cannulas in nostrils and is needing 4 LPM for sats to be at 90%. She is a nonsmoker, not on O2 at home, and so likely needs this amount of Oxygen.  Family is NOT at the point of wanting only comfort care, so they want her to have the oxygen if it is medically needed (even if she has to have restraints). Also, per my full note yesterday, they favor tube feedings if needed for SHORT period of time (so no PEG).  BUT pt was seen by me on revisit to be aksing for some milk to drink and she  got some water via sponge while I was present.   I did let family know the findings on [place] today.    Kirby Funk, MD

## 2015-04-14 NOTE — Plan of Care (Signed)
Problem: Nutrition: Goal: Adequate nutrition will be maintained Outcome: Progressing Patient received pain medications with noted relief. Medication given for anxiety. Patient receiving IV antibiotics. Foley in place and draining. Oxygen at 4L.

## 2015-04-14 NOTE — Consult Note (Signed)
Patient ID: Marcia Collins, female   DOB: [birth year], 69 y.o.   MRN: [id number]  CC: altered mental status  HPI Marcia Collins is a 69 y.o. female admitted to the oncology service with known stage IV cholangiocarcinoma. Surgery consult was asked for after workup of hypoxia with altered mental status revealed a small right apical pneumothorax. Patient was sleeping soundly and only minimally responsive at the time of my consultation. Per discussion with her children patient has not had any recent trauma or positive pressure ventilation. They state that her work of breathing has improved since admission however she mostly just sleeps. There unable to provide any significant review of systems as the patient was living independently until very recently.  HPI  Past Medical History  Diagnosis Date  . Cancer [facility]) 2007    gallbladder  . 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 Cholecystectomy  03/2006  . Hand surgery Right 2012  . Portacath placement  01/2013  . Colonoscopy  2013  . Mass excision  02-01-14    abdominal wall    Family History  Problem Relation Age of Onset  . Other Father     brain tumor    Social History Social History  Substance Use Topics  . Smoking status: Never Smoker   . Smokeless tobacco: Never Used  . Alcohol Use: No    No Known Allergies  Current Facility-Administered Medications  Medication Dose Route Frequency Provider Last Rate Last Dose  . 0.9 %  sodium chloride infusion   Intravenous Continuous Munsoor Lateef, MD 75 mL/hr at 04/14/15 1639    . acetaminophen (TYLENOL) tablet 650 mg  650 mg Oral Q4H PRN Evlyn Kanner, NP      . bisacodyl (DULCOLAX) suppository 10 mg  10 mg Rectal Daily PRN Colleen Can, MD      . Derrill Memo ON 04-30-15] dexamethasone (DECADRON) injection 10 mg  10 mg Intravenous Q24H Forest Gleason, MD      . LORazepam (ATIVAN) injection 0.5 mg  0.5 mg  Intravenous Q6H PRN Colleen Can, MD   0.5 mg at 04/14/15 1220  . meropenem (MERREM) 1 g in sodium chloride 0.9 % 100 mL IVPB  1 g Intravenous Q12H Forest Gleason, MD   1 g at 04/14/15 1226  . morphine 2 MG/ML injection 1 mg  1 mg Intravenous Q1H PRN Colleen Can, MD      . morphine 2 MG/ML injection 2 mg  2 mg Intravenous Q1H PRN Colleen Can, MD   2 mg at 04/14/15 1636  . morphine CONCENTRATE 10 MG/0.5ML oral solution 5 mg  5 mg Oral Q4H PRN Colleen Can, MD      . mupirocin ointment (BACTROBAN) 2 %   Nasal BID Colleen Can, MD      . ondansetron [facility]) injection 4 mg  4 mg Intravenous Q6H PRN Forest Gleason, MD      . pantoprazole (PROTONIX) injection 40 mg  40 mg Intravenous QHS Evlyn Kanner, NP   40 mg at 04/13/15 2354     Review of Systems Review of systems was unable to be accomplished secondary to patient's altered mental status  Physical Exam Blood pressure 97/63, pulse 76, temperature 98.1 F (36.7 C), temperature source Oral, resp. rate 20, height 5\' 8"  (1.727 m), weight 81.738 kg (180 lb 3.2 oz), SpO2 94 %. CONSTITUTIONAL: Resting in bed and arousable to noxious stimuli.  EYES: Patient did not open her eyes during my consultation. EARS, NOSE, MOUTH AND THROAT: The oropharynx is clear. The oral mucosa is dry in her lips or cracking. Hearing is unable to be assessed. LYMPH NODES:  Lymph nodes in the neck are normal. RESPIRATORY:  Lungs are clear. There is normal respiratory effort, with equal breath sounds bilaterally, and without pathologic use of accessory muscles. CARDIOVASCULAR: Heart is regular without murmurs, gallops, or rubs. GI: The abdomen is cachectic, soft, nontender, and nondistended. There are no palpable masses. There is no hepatosplenomegaly. There are normal bowel sounds in all quadrants. GU: Rectal deferred.   MUSCULOSKELETAL: Normal muscle strength and tone. No cyanosis or edema.   SKIN: Turgor is good and there are no  pathologic skin lesions or ulcers. NEUROLOGIC: Motor and sensation is grossly normal. Cranial nerves are grossly intact. PSYCH:  Oriented to person, place and time. Affect is normal.  Data Reviewed Patient's images reviewed by me with evidence of a small right apical pneumothorax. I have personally reviewed the patient's imaging, laboratory findings and medical records.    Assessment    Right apical pneumothorax    Plan    69 year old female with metastatic cholangiocarcinoma with evidence of spontaneous right sided pneumothorax. Given the absence of trauma or positive pressure ventilation it is highly likely that this pneumothorax is related to her oncologic condition. Discussed with the family at this point the plan would be to repeat the chest x-ray in the morning to evaluate whether or not this pneumothorax is stable or expanding. Given that her respiratory effort has improved and there is no discernible evidence of pneumothorax on exam it is likely that this is a stable process. However should the pneumothorax worsen patient will likely require an intervention to allow for reexpansion of her lung with a small bore chest tube versus thoracentesis. Surgery will follow along until pneumothorax process is better discerned and treated.     Time spent with the patient was 45 minutes, with more than 50% of the time spent in face-to-face education, counseling and care coordination.     Clayburn Pert, MD FACS General Surgeon 04/14/2015, 4:47 PM

## 2015-04-14 NOTE — Progress Notes (Signed)
[facility] Hematology/Oncology Progress Note  Date of admission: 04/11/2015  Hospital day:  04/14/2015  Chief Complaint: Marcia Collins is a 69 y.o. female with metastatic cholangiocarcinoma who was admitted with altered mental status, acute renal insufficiency, and right upper quadrant pain.  Subjective: Patient unable to verbalize any complaints.  Social History: The patient is accompanied by her husband and 2 sons today.  Allergies: No Known Allergies  Scheduled Medications: . [START ON April 30, 2015] dexamethasone  10 mg Intravenous Q24H  . meropenem (MERREM) IV  1 g Intravenous Q12H  . mupirocin ointment   Nasal BID  . pantoprazole  40 mg Intravenous QHS    Review of Systems: Unable to be performed as patient poorly responsive.  Physical Exam: Blood pressure 97/63, pulse 76, temperature 98.1 F (36.7 C), temperature source Oral, resp. rate 20, height 5' 8"  (1.727 m), weight 180 lb 3.2 oz (81.738 kg), SpO2 94 %.  GENERAL:  Chronically ill appearing woman lying in bed in no acute distress. MENTAL STATUS:  Poorly responsive. HEAD:  Short gray hair.  Normocephalic, atraumatic, face symmetric, no Cushingoid features. EYES:  Pupils equal round and reactive to light and accomodation.  No conjunctivitis or scleral icterus. ENT:  Oropharynx dry and without lesion.  Tongue normal. Mucous membranes moist.  RESPIRATORY:  Upper airway sounds.  Decreased breath sounds at the bases.  No wheezes or rhonchi. CARDIOVASCULAR:  Regular rate and rhythm without murmur, rub or gallop. ABDOMEN:  Soft, tender RUQ without rebound tenderness.  Active bowel sounds and no hepatosplenomegaly.  No masses. SKIN:  No rashes, ulcers or lesions. EXTREMITIES: No edema, no skin discoloration or tenderness.  No palpable cords. LYMPH NODES: No palpable cervical, supraclavicular, axillary or inguinal adenopathy  NEUROLOGICAL: Opens eyes and squeezes right hand only.  Left upper and lower  extremity with minimal movement.  No clonus or Babinski.  Grimaces to pain. PSYCH:  Poorly responsive.  Results for orders placed or performed during the hospital encounter of 04/14/2015 (from the past 48 hour(s))  Culture, blood (routine x 2)     Status: None (Preliminary result)   Collection Time: 04/18/2015  9:31 PM  Result Value Ref Range   Specimen Description BLOOD RIGHT ANTECUBITAL    Special Requests BOTTLES DRAWN AEROBIC AND ANAEROBIC 10ML    Culture  Setup Time      GRAM NEGATIVE RODS IN BOTH AEROBIC AND ANAEROBIC BOTTLES CRITICAL RESULT CALLED TO, READ BACK BY AND VERIFIED WITH: CHRISTINE KATSOUDAS 04/13/15 1201PM MLM    Culture      ESCHERICHIA COLI IN BOTH AEROBIC AND ANAEROBIC BOTTLES SUSCEPTIBILITIES TO FOLLOW    Report Status PENDING   Culture, blood (routine x 2)     Status: None (Preliminary result)   Collection Time: 04/26/2015  9:31 PM  Result Value Ref Range   Specimen Description BLOOD LEFT ANTECUBITAL    Special Requests BOTTLES DRAWN AEROBIC AND ANAEROBIC 5ML    Culture  Setup Time      GRAM NEGATIVE RODS IN BOTH AEROBIC AND ANAEROBIC BOTTLES CRITICAL VALUE NOTED.  VALUE IS CONSISTENT WITH PREVIOUSLY REPORTED AND CALLED VALUE.    Culture      ESCHERICHIA COLI IN BOTH AEROBIC AND ANAEROBIC BOTTLES SUSCEPTIBILITIES TO FOLLOW    Report Status PENDING   Blood Culture ID Panel (Reflexed)     Status: Abnormal   Collection Time: 04/20/2015  9:31 PM  Result Value Ref Range   Enterococcus species NOT DETECTED NOT DETECTED   Listeria monocytogenes NOT  DETECTED NOT DETECTED   Staphylococcus species NOT DETECTED NOT DETECTED   Staphylococcus aureus NOT DETECTED NOT DETECTED   Streptococcus species NOT DETECTED NOT DETECTED   Streptococcus agalactiae NOT DETECTED NOT DETECTED   Streptococcus pneumoniae NOT DETECTED NOT DETECTED   Streptococcus pyogenes NOT DETECTED NOT DETECTED   Acinetobacter baumannii NOT DETECTED NOT DETECTED   Enterobacteriaceae species NOT  DETECTED NOT DETECTED   Enterobacter cloacae complex NOT DETECTED NOT DETECTED   Escherichia coli DETECTED (A) NOT DETECTED    Comment: CRITICAL RESULT VERFIED AND CALLED TO CHRISTINE KATSOUDAS 04/13/15 1201PM MLM   Klebsiella oxytoca NOT DETECTED NOT DETECTED   Klebsiella pneumoniae NOT DETECTED NOT DETECTED   Proteus species NOT DETECTED NOT DETECTED   Serratia marcescens NOT DETECTED NOT DETECTED   Haemophilus influenzae NOT DETECTED NOT DETECTED   Neisseria meningitidis NOT DETECTED NOT DETECTED   Pseudomonas aeruginosa NOT DETECTED NOT DETECTED   Candida albicans NOT DETECTED NOT DETECTED   Candida glabrata NOT DETECTED NOT DETECTED   Candida krusei NOT DETECTED NOT DETECTED   Candida parapsilosis NOT DETECTED NOT DETECTED   Candida tropicalis NOT DETECTED NOT DETECTED   Carbapenem resistance NOT DETECTED NOT DETECTED   Methicillin resistance NOT DETECTED NOT DETECTED   Vancomycin resistance NOT DETECTED NOT DETECTED  Comprehensive metabolic panel     Status: Abnormal   Collection Time: 04/13/15  5:15 AM  Result Value Ref Range   Sodium 133 (L) 135 - 145 mmol/L   Potassium 4.6 3.5 - 5.1 mmol/L   Chloride 104 101 - 111 mmol/L   CO2 23 22 - 32 mmol/L   Glucose, Bld 144 (H) 65 - 99 mg/dL   BUN 84 (H) 6 - 20 mg/dL   Creatinine, Ser 1.90 (H) 0.44 - 1.00 mg/dL   Calcium 7.5 (L) 8.9 - 10.3 mg/dL   Total Protein 6.0 (L) 6.5 - 8.1 g/dL   Albumin 1.5 (L) 3.5 - 5.0 g/dL   AST 20 15 - 41 U/L   ALT 19 14 - 54 U/L   Alkaline Phosphatase 90 38 - 126 U/L   Total Bilirubin 3.5 (H) 0.3 - 1.2 mg/dL   GFR calc non Af Amer 26 (L) >60 mL/min   GFR calc Af Amer 30 (L) >60 mL/min    Comment: (NOTE) The eGFR has been calculated using the CKD EPI equation. This calculation has not been validated in all clinical situations. eGFR's persistently <60 mL/min signify possible Chronic Kidney Disease.    Anion gap 6 5 - 15  CBC with Differential/Platelet     Status: Abnormal   Collection Time:  04/13/15  5:15 AM  Result Value Ref Range   WBC 20.2 (H) 3.6 - 11.0 K/uL   RBC 2.73 (L) 3.80 - 5.20 MIL/uL   Hemoglobin 7.7 (L) 12.0 - 16.0 g/dL   HCT 22.9 (L) 35.0 - 47.0 %   MCV 84.1 80.0 - 100.0 fL   MCH 28.1 26.0 - 34.0 pg   MCHC 33.4 32.0 - 36.0 g/dL   RDW 17.1 (H) 11.5 - 14.5 %   Platelets 63 (L) 150 - 440 K/uL   Neutrophils Relative % 97 %   Lymphocytes Relative 1 %   Monocytes Relative 2 %   Eosinophils Relative 0 %   Basophils Relative 0 %   Neutro Abs 19.6 (H) 1.4 - 6.5 K/uL   Lymphs Abs 0.2 (L) 1.0 - 3.6 K/uL   Monocytes Absolute 0.4 0.2 - 0.9 K/uL   Eosinophils Absolute 0.0 0 -  0.7 K/uL   Basophils Absolute 0.0 0 - 0.1 K/uL   RBC Morphology MIXED RBC POPULATION    Smear Review PLATELETS APPEAR DECREASED   CBC with Differential/Platelet     Status: Abnormal   Collection Time: 04/14/15  5:07 AM  Result Value Ref Range   WBC 19.0 (H) 3.6 - 11.0 K/uL   RBC 2.78 (L) 3.80 - 5.20 MIL/uL   Hemoglobin 7.7 (L) 12.0 - 16.0 g/dL   HCT 23.3 (L) 35.0 - 47.0 %   MCV 83.9 80.0 - 100.0 fL   MCH 27.6 26.0 - 34.0 pg   MCHC 33.0 32.0 - 36.0 g/dL   RDW 16.9 (H) 11.5 - 14.5 %   Platelets 51 (L) 150 - 440 K/uL   Neutrophils Relative % 97% %   Neutro Abs 18.3 (H) 1.4 - 6.5 K/uL   Lymphocytes Relative 1% %   Lymphs Abs 0.2 (L) 1.0 - 3.6 K/uL   Monocytes Relative 2% %   Monocytes Absolute 0.4 0.2 - 0.9 K/uL   Eosinophils Relative 0% %   Eosinophils Absolute 0.0 0 - 0.7 K/uL   Basophils Relative 0% %   Basophils Absolute 0.0 0 - 0.1 K/uL  Comprehensive metabolic panel     Status: Abnormal   Collection Time: 04/14/15  5:07 AM  Result Value Ref Range   Sodium 144 135 - 145 mmol/L   Potassium 4.6 3.5 - 5.1 mmol/L   Chloride 117 (H) 101 - 111 mmol/L   CO2 24 22 - 32 mmol/L   Glucose, Bld 153 (H) 65 - 99 mg/dL   BUN 73 (H) 6 - 20 mg/dL   Creatinine, Ser 1.37 (H) 0.44 - 1.00 mg/dL   Calcium 8.2 (L) 8.9 - 10.3 mg/dL   Total Protein 5.8 (L) 6.5 - 8.1 g/dL   Albumin 1.6 (L) 3.5 - 5.0  g/dL   AST 24 15 - 41 U/L   ALT 24 14 - 54 U/L   Alkaline Phosphatase 90 38 - 126 U/L   Total Bilirubin 4.6 (H) 0.3 - 1.2 mg/dL   GFR calc non Af Amer 38 (L) >60 mL/min   GFR calc Af Amer 44 (L) >60 mL/min    Comment: (NOTE) The eGFR has been calculated using the CKD EPI equation. This calculation has not been validated in all clinical situations. eGFR's persistently <60 mL/min signify possible Chronic Kidney Disease.    Anion gap 3 (L) 5 - 15   Dg Chest 2 View  04/13/2015  CLINICAL DATA:  Hypoxia. EXAM: CHEST  2 VIEW COMPARISON:  PET-CT scan 01/2015.  Chest radiograph 01/08/2013. FINDINGS: There is a central venous catheter with tip over the cavoatrial junction region. There is a small Right apical pneumothorax. This extends to about the level of the fourth rib, measuring about 3.1 cm at the apex. There is diffuse infiltration and volume loss in the right lung. Small right pleural effusion. Bilateral perihilar infiltrates may represent edema. Mild cardiac enlargement. Degenerative changes in the shoulders. IMPRESSION: Small right apical pneumothorax. Atelectasis in the right lung base. Small right pleural effusion. Bilateral perihilar infiltration suggesting edema or pneumonia. These results were called by telephone at the time of interpretation on 04/13/2015 at 9:30 pm to Dr. Myriam Jacobson, the patient's nurse on oncology 1C, who verbally acknowledged these results. Electronically Signed   By: Lucienne Capers M.D.   On: 04/13/2015 21:33   Ct Head Wo Contrast  04/13/2015  CLINICAL DATA:  Altered mental status. Hypoxia. Nonresponsive today. EXAM: CT  HEAD WITHOUT CONTRAST TECHNIQUE: Contiguous axial images were obtained from the base of the skull through the vertex without intravenous contrast. COMPARISON:  MRI brain 08/07/2014.  CT head 08/06/2014. FINDINGS: The mostly cystic mass demonstrated in the left posterior periventricular region and extending into the posterior corpus callosum across the  midline. This is likely represent primary or metastatic disease. The lesion is smaller than on previous study and previous areas of increased density have significantly decreased. The lesion today measures about 2.5 x 3.8 cm. There is only a small residual area of increased density adjacent to the left lateral ventricle measuring about 4 mm. This could be related to the tumor may indicate an area of hemorrhage. It appears to been present previously. There is mild white matter edema in the left parietal region. No significant midline shift. There is displacement of the left lateral ventricle with mild dilatation of the posterior horn of the left lateral ventricle. No additional intracranial lesions are identified. Mild diffuse atrophy. Low-attenuation changes in the deep white matter consistent with small vessel ischemia. No abnormal extra-axial fluid collections. Gray-white matter junctions are distinct. Basal cisterns are not effaced. Postoperative changes with left posterior parietal craniotomy and mesh closure. No depressed skull fractures. Visualized paranasal sinuses are not opacified. IMPRESSION: Decreased size of the cystic mass in the left posterior periventricular region. Small focal area of increased density could represent calcification or hemorrhage. Areas of increased density are decreased since previous study and likely related to the known tumor. White matter edema in the left parietal lobe. No definite evidence of any acute changes. Electronically Signed   By: Lucienne Capers M.D.   On: 04/13/2015 21:47   US Abdomen Complete  04/14/2015  CLINICAL DATA:  One week of right-sided abdominal pain, history of gallbladder malignancy status post cholecystectomy; the patient is unable to cooperate with breath hold techniques or to move the side side and is in considerable pain EXAM: ULTRASOUND ABDOMEN COMPLETE COMPARISON:  PET-CT study of February 25, 2015 FINDINGS: Gallbladder: The gallbladder is  surgically absent Common bile duct: Diameter: 2 mm Liver: In the right hepatic lobe there is a 1.3 x 1 x 1.4 cm hypoechoic structure that is not clearly a cyst. There is gas in the biliary tree. The left lobe hepatic ducts are mildly prominent. IVC: Within the inferior vena cava there are echoes there is echogenic material worrisome for either bland or tumor thrombus. This is not completely obstructing the IVC. Pancreas: Bowel gas and the patient's clinical condition limits evaluation of the pancreatic tail. The pancreatic head and body are grossly normal where visualized. Spleen: Size and appearance within normal limits. Right Kidney: Length: 10.9 cm. Echogenicity within normal limits. No mass or hydronephrosis visualized. Left Kidney: Length: 11.0 cm. Echogenicity within normal limits. No mass or hydronephrosis visualized. Abdominal aorta: No aortic aneurysm is demonstrated. The iliac arteries were obscured by bowel gas. Other findings: No ascites is demonstrated. There is a right pleural effusion containing echogenic material. IMPRESSION: 1. Thrombus either bland or tumor in nature is present in the IVC. 2. Hypoechoic mass in the right hepatic lobe measuring 1.4 cm in greatest dimension. There is air in the biliary tree which not unexpected post cholecystectomy and sphincterotomy. There is mild prominence of the left lobe hepatic ducts. 3. There is a right pleural effusion containing debris. 4. The kidneys, abdominal aorta, and spleen are unremarkable. Electronically Signed   By: David  Martinique M.D.   On: 04/14/2015 10:12    Assessment:  Byrd Terrero is a 70 y.o. 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  She was initially diagnosed with stage I disease s/p resection in 05/2006.  She was diagnosed with multiple liver and bone metastasis in 12/2012.  She received oxaliplatin ans gemcitabine.  She underwent resection of a right sided abdominal nodule in 01/2014.  She was diagnosed with brain metastasis in  08/2014 and received radiation.  Abdominal ultrasound today reveals no hydronephrosis.  There is a new 1.4 cm mass in the right lobe of the liver consistent with metastatic disease.  There is a bland or tumor thrombus in the IVC. Head MRI was unable to be obtained today secondary to the patient's agitation.  Plan: 1. Hematology/Oncology:  Stage IV cholangiocarcinoma with imaging studies revealing progressive disease.  Discussed with patient's son.  Prognosis poor.  Etiology of anemia and thrombocytopenia unclear as patient has not received chemotherapy in some time.  Suspect thrombocytopenia may be secondary to poor marrow reserve and consumption.due to infection.  Labs ordered.  Transfuse as needed.  No evidence of bleeding.  Nutrition poor.  Given suspected tumor thrombus and thrombocytopenia, deferring anticoagulation at this time.  2. Infectious disease:  E Coli bacteremia and Klebsiella UTI.  Patient on meropenem.  Appreciate ID and hospitalist consult. 3. Renal:  Renal insufficiency improving with gentle hydration.  Etiology likely secondary to ATN.  No evidence of hydronephrosis on ultrasound.  Nutrition poor (albumen 1.6).  Appreciate nephrology consult 4. Pulmonary:  No respiratory distress.  Apical pneumothorax noted on CXR.  Follow-up CXR in AM.  Appreciate surgery consult. 5.   Neurology:  Encephalopathy likely secondary to  Infection.  Unable to perform head MRI today secondary to agitation.  6.   Code status:  DNR.    Lequita Asal, MD  04/14/2015, 8:59 PM

## 2015-04-14 NOTE — Consult Note (Signed)
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 Metro Kung office for worsening right flank pain and altered mental status. She was also reported to have decreased oral intake. She was noted to be in acute renal failure, elevated WBC count (37K) and her blood cultures from admission are growing gram-negative rods - Ided as E coli. UCX growing Klebsiella. UA on admission had only 0-5 wbc but had wbc clumps present. Abd USS shows a liver lesion and air in biliary tree. T bili is elevated - had been nml 1 month ago.  Patient's mental status hasn't improved in the last day, remains in renal failure and having worsening oxygen requirements now  Past Medical History  Diagnosis Date  . Cancer [facility]) 2007    gallbladder  . Metastasis from gallbladder cancer ([place]) 11/05/2014   Past Surgical History  Procedure Laterality Date  . Cholecystectomy  03/2006  . Hand surgery Right 2012  . Portacath placement  01/2013  . Colonoscopy  2013  . Mass excision  02-01-14    abdominal wall   Social History  Substance Use Topics  . Smoking status: Never Smoker   . Smokeless tobacco: Never Used  . Alcohol Use: No   Family History  Problem Relation Age of Onset  . Other Father     brain tumor    Allergies: No Known Allergies  Current antibiotics: Antibiotics Given (last 72 hours)    Date/Time Action Medication Dose Rate   03/30/2015 1947 Given   ciprofloxacin (CIPRO) IVPB 200 mg 200 mg 100 mL/hr   04/26/2015 1948 Given   cefTRIAXone (ROCEPHIN)  1 g in dextrose 5 % 50 mL IVPB 1 g 100 mL/hr   04/13/15 0810 Given   ciprofloxacin (CIPRO) IVPB 200 mg 200 mg 100 mL/hr   04/13/15 1338 Given   meropenem (MERREM) 500 mg in sodium chloride 0.9 % 50 mL IVPB 500 mg 100 mL/hr   04/13/15 2001 Given   ciprofloxacin (CIPRO) IVPB 200 mg 200 mg 100 mL/hr   04/14/15 0003 Given   meropenem (MERREM) 500 mg in sodium chloride 0.9 % 50 mL IVPB 500 mg 100 mL/hr   04/14/15 1226 Given   meropenem (MERREM) 1 g in sodium chloride 0.9 % 100 mL IVPB 1 g 200 mL/hr      MEDICATIONS: . [START ON 04-20-15] dexamethasone  10 mg Intravenous Q24H  . meropenem (MERREM) IV  1 g Intravenous Q12H  . mupirocin ointment   Nasal BID  . pantoprazole  40 mg Intravenous QHS    Review of Systems - unable to obtain   OBJECTIVE: Temp:  [97.6 F (36.4 C)-98.3 F (36.8 C)] 98.1 F (36.7 C) (12/16 1304) Pulse Rate:  [76-96] 76 (12/16 1304) Resp:  [20-24] 20 (12/16 1304) BP: (97-124)/(55-63) 97/63 mmHg (12/16 1304) SpO2:  [90 %-94 %] 94 % (12/16 1304) FiO2 (%):  [36 %] 36 % (12/16 1146) Physical Exam  Constitutional:  Obtuinded, ill appearing  HENT: [place]/AT, PERRLA, + icterus Mouth/Throat: Oropharynx is very dry.  Cardiovascular: Normal rate, regular rhythm and normal heart sounds.  Pulmonary/Chest: rhonchi Neck  supple, no nuchal rigidity Abdominal: distended, diff to assess pain but grimace with palp RUQ Lymphadenopathy: no cervical adenopathy. No axillary adenopathy Neurological: obtunded  Skin: Skin is warm and dry. No rash noted. No erythema.  Psychiatric: obtudned Access- portacath chest wall wnl  LABS: Results for orders placed or performed during the hospital encounter of 04/07/2015 (from the past 48 hour(s))  CBC WITH DIFFERENTIAL     Status: Abnormal   Collection Time: 04/28/2015  5:12 PM  Result Value Ref Range   WBC 29.5 (H) 3.6 - 11.0 K/uL   RBC 3.32 (L) 3.80 - 5.20 MIL/uL   Hemoglobin 9.3 (L) 12.0 - 16.0 g/dL   HCT 27.8 (L) 35.0 - 47.0 %   MCV  83.8 80.0 - 100.0 fL   MCH 28.0 26.0 - 34.0 pg   MCHC 33.4 32.0 - 36.0 g/dL   RDW 16.7 (H) 11.5 - 14.5 %   Platelets 72 (L) 150 - 440 K/uL   Neutrophils Relative % 93.000000 %   Lymphocytes Relative 1.000000 %   Monocytes Relative 6.000000 %   Eosinophils Relative 0.000000 %   Basophils Relative 0.000000 %   Neutro Abs 27.4 (H) 1.4 - 6.5 K/uL   Lymphs Abs 0.3 (L) 1.0 - 3.6 K/uL   Monocytes Absolute 1.8 (H) 0.2 - 0.9 K/uL   Eosinophils Absolute 0.0 0 - 0.7 K/uL   Basophils Absolute 0.0 0 - 0.1 K/uL   RBC Morphology MIXED RBC POPULATION     Comment: HYPOCHROMIC  Comprehensive metabolic panel     Status: Abnormal   Collection Time: 04/11/2015  5:12 PM  Result Value Ref Range   Sodium 132 (L) 135 - 145 mmol/L   Potassium 4.8 3.5 - 5.1 mmol/L   Chloride 99 (L) 101 - 111 mmol/L   CO2 21 (L) 22 - 32 mmol/L   Glucose, Bld 145 (H) 65 - 99 mg/dL   BUN 100 (H) 6 - 20 mg/dL   Creatinine, Ser 2.67 (H) 0.44 - 1.00 mg/dL   Calcium 8.2 (L) 8.9 - 10.3 mg/dL   Total Protein 7.1 6.5 - 8.1 g/dL   Albumin 1.9 (L) 3.5 - 5.0 g/dL   AST 28 15 - 41 U/L   ALT 23 14 - 54 U/L   Alkaline Phosphatase 121 38 - 126 U/L   Total Bilirubin 5.1 (H) 0.3 - 1.2 mg/dL   GFR calc non Af Amer 17 (L) >60 mL/min   GFR calc Af Amer 20 (L) >60 mL/min    Comment: (NOTE) The eGFR has been calculated using the CKD EPI equation. This calculation has not been validated in all clinical situations. eGFR's persistently <60 mL/min signify possible Chronic Kidney Disease.    Anion gap 12 5 - 15  Magnesium     Status: Abnormal   Collection Time: 04/20/2015  5:12 PM  Result Value Ref Range   Magnesium 2.5 (H) 1.7 - 2.4 mg/dL  Urine culture     Status: None   Collection Time: 04/27/2015  5:12 PM  Result Value Ref Range   Specimen Description URINE, CLEAN CATCH    Special Requests NONE    Culture >=100,000 COLONIES/mL KLEBSIELLA OXYTOCA    Report Status 04/14/2015 FINAL    Organism ID, Bacteria KLEBSIELLA OXYTOCA        Susceptibility   Klebsiella oxytoca - MIC*    AMPICILLIN 16 RESISTANT Resistant     CEFAZOLIN <=4 SENSITIVE  Sensitive     CEFTRIAXONE <=1 SENSITIVE Sensitive     CIPROFLOXACIN <=0.25 SENSITIVE Sensitive     GENTAMICIN <=1 SENSITIVE Sensitive     IMIPENEM <=0.25 SENSITIVE Sensitive     NITROFURANTOIN <=16 SENSITIVE Sensitive     TRIMETH/SULFA <=20 SENSITIVE Sensitive     PIP/TAZO Value in next row Sensitive      SENSITIVE<=4    LEVOFLOXACIN Value in next row Sensitive      SENSITIVE<=0.12    * >=100,000 COLONIES/mL KLEBSIELLA OXYTOCA  Urinalysis complete, with microscopic ([facility] only)     Status: Abnormal   Collection Time: 04/27/2015  5:12 PM  Result Value Ref Range   Color, Urine AMBER (A) YELLOW   APPearance CLOUDY (A) CLEAR   Glucose, UA NEGATIVE NEGATIVE mg/dL   Bilirubin Urine 1+ (A) NEGATIVE   Ketones, ur NEGATIVE NEGATIVE mg/dL   Specific Gravity, Urine 1.015 1.005 - 1.030   Hgb urine dipstick NEGATIVE NEGATIVE   pH 5.0 5.0 - 8.0   Protein, ur NEGATIVE NEGATIVE mg/dL   Nitrite NEGATIVE NEGATIVE   Leukocytes, UA TRACE (A) NEGATIVE   RBC / HPF NONE SEEN 0 - 5 RBC/hpf   WBC, UA 0-5 0 - 5 WBC/hpf   Bacteria, UA MANY (A) NONE SEEN   Squamous Epithelial / LPF 0-5 (A) NONE SEEN   WBC Clumps PRESENT    Mucous PRESENT    Hyaline Casts, UA PRESENT   Culture, blood (routine x 2)     Status: None (Preliminary result)   Collection Time: 04/07/2015  9:31 PM  Result Value Ref Range   Specimen Description BLOOD RIGHT ANTECUBITAL    Special Requests BOTTLES DRAWN AEROBIC AND ANAEROBIC 10ML    Culture  Setup Time      GRAM NEGATIVE RODS IN BOTH AEROBIC AND ANAEROBIC BOTTLES CRITICAL RESULT CALLED TO, READ BACK BY AND VERIFIED WITH: CHRISTINE KATSOUDAS 04/13/15 1201PM MLM    Culture      ESCHERICHIA COLI IN BOTH AEROBIC AND ANAEROBIC BOTTLES SUSCEPTIBILITIES TO FOLLOW    Report Status PENDING   Culture, blood (routine x 2)     Status: None (Preliminary result)   Collection Time:  04/29/2015  9:31 PM  Result Value Ref Range   Specimen Description BLOOD LEFT ANTECUBITAL    Special Requests BOTTLES DRAWN AEROBIC AND ANAEROBIC 5ML    Culture  Setup Time      GRAM NEGATIVE RODS IN BOTH AEROBIC AND ANAEROBIC BOTTLES CRITICAL VALUE NOTED.  VALUE IS CONSISTENT WITH PREVIOUSLY REPORTED AND CALLED VALUE.    Culture      ESCHERICHIA COLI IN BOTH AEROBIC AND ANAEROBIC BOTTLES SUSCEPTIBILITIES TO FOLLOW    Report Status PENDING   Blood Culture ID Panel (Reflexed)     Status: Abnormal   Collection Time: 04/06/2015  9:31 PM  Result Value Ref Range   Enterococcus species NOT DETECTED NOT DETECTED   Listeria monocytogenes NOT DETECTED NOT DETECTED   Staphylococcus species NOT DETECTED NOT DETECTED   Staphylococcus aureus NOT DETECTED NOT DETECTED   Streptococcus species NOT DETECTED NOT DETECTED   Streptococcus agalactiae NOT DETECTED NOT DETECTED   Streptococcus pneumoniae NOT DETECTED NOT DETECTED   Streptococcus pyogenes NOT DETECTED NOT DETECTED   Acinetobacter baumannii NOT DETECTED NOT DETECTED   Enterobacteriaceae species NOT DETECTED NOT DETECTED   Enterobacter cloacae complex NOT DETECTED NOT DETECTED   Escherichia coli DETECTED (A) NOT DETECTED    Comment: CRITICAL RESULT VERFIED AND CALLED TO CHRISTINE KATSOUDAS 04/13/15 1201PM MLM  Klebsiella oxytoca NOT DETECTED NOT DETECTED   Klebsiella pneumoniae NOT DETECTED NOT DETECTED   Proteus species NOT DETECTED NOT DETECTED   Serratia marcescens NOT DETECTED NOT DETECTED   Haemophilus influenzae NOT DETECTED NOT DETECTED   Neisseria meningitidis NOT DETECTED NOT DETECTED   Pseudomonas aeruginosa NOT DETECTED NOT DETECTED   Candida albicans NOT DETECTED NOT DETECTED   Candida glabrata NOT DETECTED NOT DETECTED   Candida krusei NOT DETECTED NOT DETECTED   Candida parapsilosis NOT DETECTED NOT DETECTED   Candida tropicalis NOT DETECTED NOT DETECTED   Carbapenem resistance NOT DETECTED NOT DETECTED   Methicillin  resistance NOT DETECTED NOT DETECTED   Vancomycin resistance NOT DETECTED NOT DETECTED  Comprehensive metabolic panel     Status: Abnormal   Collection Time: 04/13/15  5:15 AM  Result Value Ref Range   Sodium 133 (L) 135 - 145 mmol/L   Potassium 4.6 3.5 - 5.1 mmol/L   Chloride 104 101 - 111 mmol/L   CO2 23 22 - 32 mmol/L   Glucose, Bld 144 (H) 65 - 99 mg/dL   BUN 84 (H) 6 - 20 mg/dL   Creatinine, Ser 1.90 (H) 0.44 - 1.00 mg/dL   Calcium 7.5 (L) 8.9 - 10.3 mg/dL   Total Protein 6.0 (L) 6.5 - 8.1 g/dL   Albumin 1.5 (L) 3.5 - 5.0 g/dL   AST 20 15 - 41 U/L   ALT 19 14 - 54 U/L   Alkaline Phosphatase 90 38 - 126 U/L   Total Bilirubin 3.5 (H) 0.3 - 1.2 mg/dL   GFR calc non Af Amer 26 (L) >60 mL/min   GFR calc Af Amer 30 (L) >60 mL/min    Comment: (NOTE) The eGFR has been calculated using the CKD EPI equation. This calculation has not been validated in all clinical situations. eGFR's persistently <60 mL/min signify possible Chronic Kidney Disease.    Anion gap 6 5 - 15  CBC with Differential/Platelet     Status: Abnormal   Collection Time: 04/13/15  5:15 AM  Result Value Ref Range   WBC 20.2 (H) 3.6 - 11.0 K/uL   RBC 2.73 (L) 3.80 - 5.20 MIL/uL   Hemoglobin 7.7 (L) 12.0 - 16.0 g/dL   HCT 22.9 (L) 35.0 - 47.0 %   MCV 84.1 80.0 - 100.0 fL   MCH 28.1 26.0 - 34.0 pg   MCHC 33.4 32.0 - 36.0 g/dL   RDW 17.1 (H) 11.5 - 14.5 %   Platelets 63 (L) 150 - 440 K/uL   Neutrophils Relative % 97 %   Lymphocytes Relative 1 %   Monocytes Relative 2 %   Eosinophils Relative 0 %   Basophils Relative 0 %   Neutro Abs 19.6 (H) 1.4 - 6.5 K/uL   Lymphs Abs 0.2 (L) 1.0 - 3.6 K/uL   Monocytes Absolute 0.4 0.2 - 0.9 K/uL   Eosinophils Absolute 0.0 0 - 0.7 K/uL   Basophils Absolute 0.0 0 - 0.1 K/uL   RBC Morphology MIXED RBC POPULATION    Smear Review PLATELETS APPEAR DECREASED   CBC with Differential/Platelet     Status: Abnormal   Collection Time: 04/14/15  5:07 AM  Result Value Ref Range   WBC  19.0 (H) 3.6 - 11.0 K/uL   RBC 2.78 (L) 3.80 - 5.20 MIL/uL   Hemoglobin 7.7 (L) 12.0 - 16.0 g/dL   HCT 23.3 (L) 35.0 - 47.0 %   MCV 83.9 80.0 - 100.0 fL   MCH 27.6 26.0 -  34.0 pg   MCHC 33.0 32.0 - 36.0 g/dL   RDW 16.9 (H) 11.5 - 14.5 %   Platelets 51 (L) 150 - 440 K/uL   Neutrophils Relative % 97% %   Neutro Abs 18.3 (H) 1.4 - 6.5 K/uL   Lymphocytes Relative 1% %   Lymphs Abs 0.2 (L) 1.0 - 3.6 K/uL   Monocytes Relative 2% %   Monocytes Absolute 0.4 0.2 - 0.9 K/uL   Eosinophils Relative 0% %   Eosinophils Absolute 0.0 0 - 0.7 K/uL   Basophils Relative 0% %   Basophils Absolute 0.0 0 - 0.1 K/uL  Comprehensive metabolic panel     Status: Abnormal   Collection Time: 04/14/15  5:07 AM  Result Value Ref Range   Sodium 144 135 - 145 mmol/L   Potassium 4.6 3.5 - 5.1 mmol/L   Chloride 117 (H) 101 - 111 mmol/L   CO2 24 22 - 32 mmol/L   Glucose, Bld 153 (H) 65 - 99 mg/dL   BUN 73 (H) 6 - 20 mg/dL   Creatinine, Ser 1.37 (H) 0.44 - 1.00 mg/dL   Calcium 8.2 (L) 8.9 - 10.3 mg/dL   Total Protein 5.8 (L) 6.5 - 8.1 g/dL   Albumin 1.6 (L) 3.5 - 5.0 g/dL   AST 24 15 - 41 U/L   ALT 24 14 - 54 U/L   Alkaline Phosphatase 90 38 - 126 U/L   Total Bilirubin 4.6 (H) 0.3 - 1.2 mg/dL   GFR calc non Af Amer 38 (L) >60 mL/min   GFR calc Af Amer 44 (L) >60 mL/min    Comment: (NOTE) The eGFR has been calculated using the CKD EPI equation. This calculation has not been validated in all clinical situations. eGFR's persistently <60 mL/min signify possible Chronic Kidney Disease.    Anion gap 3 (L) 5 - 15   No components found for: ESR, C REACTIVE PROTEIN MICRO: Recent Results (from the past 720 hour(s))  Urine culture     Status: None   Collection Time: 04/29/2015  5:12 PM  Result Value Ref Range Status   Specimen Description URINE, CLEAN CATCH  Final   Special Requests NONE  Final   Culture >=100,000 COLONIES/mL KLEBSIELLA OXYTOCA  Final   Report Status 04/14/2015 FINAL  Final   Organism ID,  Bacteria KLEBSIELLA OXYTOCA  Final      Susceptibility   Klebsiella oxytoca - MIC*    AMPICILLIN 16 RESISTANT Resistant     CEFAZOLIN <=4 SENSITIVE Sensitive     CEFTRIAXONE <=1 SENSITIVE Sensitive     CIPROFLOXACIN <=0.25 SENSITIVE Sensitive     GENTAMICIN <=1 SENSITIVE Sensitive     IMIPENEM <=0.25 SENSITIVE Sensitive     NITROFURANTOIN <=16 SENSITIVE Sensitive     TRIMETH/SULFA <=20 SENSITIVE Sensitive     PIP/TAZO Value in next row Sensitive      SENSITIVE<=4    LEVOFLOXACIN Value in next row Sensitive      SENSITIVE<=0.12    * >=100,000 COLONIES/mL KLEBSIELLA OXYTOCA  Culture, blood (routine x 2)     Status: None (Preliminary result)   Collection Time: 04/18/2015  9:31 PM  Result Value Ref Range Status   Specimen Description BLOOD RIGHT ANTECUBITAL  Final   Special Requests BOTTLES DRAWN AEROBIC AND ANAEROBIC 10ML  Final   Culture  Setup Time   Final    GRAM NEGATIVE RODS IN BOTH AEROBIC AND ANAEROBIC BOTTLES CRITICAL RESULT CALLED TO, READ BACK BY AND VERIFIED WITH: CHRISTINE KATSOUDAS 04/13/15  1201PM MLM    Culture   Final    ESCHERICHIA COLI IN BOTH AEROBIC AND ANAEROBIC BOTTLES SUSCEPTIBILITIES TO FOLLOW    Report Status PENDING  Incomplete  Culture, blood (routine x 2)     Status: None (Preliminary result)   Collection Time: 04/09/2015  9:31 PM  Result Value Ref Range Status   Specimen Description BLOOD LEFT ANTECUBITAL  Final   Special Requests BOTTLES DRAWN AEROBIC AND ANAEROBIC 5ML  Final   Culture  Setup Time   Final    GRAM NEGATIVE RODS IN BOTH AEROBIC AND ANAEROBIC BOTTLES CRITICAL VALUE NOTED.  VALUE IS CONSISTENT WITH PREVIOUSLY REPORTED AND CALLED VALUE.    Culture   Final    ESCHERICHIA COLI IN BOTH AEROBIC AND ANAEROBIC BOTTLES SUSCEPTIBILITIES TO FOLLOW    Report Status PENDING  Incomplete  Blood Culture ID Panel (Reflexed)     Status: Abnormal   Collection Time: 04/03/2015  9:31 PM  Result Value Ref Range Status   Enterococcus species NOT DETECTED  NOT DETECTED Final   Listeria monocytogenes NOT DETECTED NOT DETECTED Final   Staphylococcus species NOT DETECTED NOT DETECTED Final   Staphylococcus aureus NOT DETECTED NOT DETECTED Final   Streptococcus species NOT DETECTED NOT DETECTED Final   Streptococcus agalactiae NOT DETECTED NOT DETECTED Final   Streptococcus pneumoniae NOT DETECTED NOT DETECTED Final   Streptococcus pyogenes NOT DETECTED NOT DETECTED Final   Acinetobacter baumannii NOT DETECTED NOT DETECTED Final   Enterobacteriaceae species NOT DETECTED NOT DETECTED Final   Enterobacter cloacae complex NOT DETECTED NOT DETECTED Final   Escherichia coli DETECTED (A) NOT DETECTED Final    Comment: CRITICAL RESULT VERFIED AND CALLED TO CHRISTINE KATSOUDAS 04/13/15 1201PM MLM   Klebsiella oxytoca NOT DETECTED NOT DETECTED Final   Klebsiella pneumoniae NOT DETECTED NOT DETECTED Final   Proteus species NOT DETECTED NOT DETECTED Final   Serratia marcescens NOT DETECTED NOT DETECTED Final   Haemophilus influenzae NOT DETECTED NOT DETECTED Final   Neisseria meningitidis NOT DETECTED NOT DETECTED Final   Pseudomonas aeruginosa NOT DETECTED NOT DETECTED Final   Candida albicans NOT DETECTED NOT DETECTED Final   Candida glabrata NOT DETECTED NOT DETECTED Final   Candida krusei NOT DETECTED NOT DETECTED Final   Candida parapsilosis NOT DETECTED NOT DETECTED Final   Candida tropicalis NOT DETECTED NOT DETECTED Final   Carbapenem resistance NOT DETECTED NOT DETECTED Final   Methicillin resistance NOT DETECTED NOT DETECTED Final   Vancomycin resistance NOT DETECTED NOT DETECTED Final    IMAGING: Dg Chest 2 View  04/13/2015  CLINICAL DATA:  Hypoxia. EXAM: CHEST  2 VIEW COMPARISON:  PET-CT scan 01/2015.  Chest radiograph 01/08/2013. FINDINGS: There is a central venous catheter with tip over the cavoatrial junction region. There is a small Right apical pneumothorax. This extends to about the level of the fourth rib, measuring about 3.1 cm at  the apex. There is diffuse infiltration and volume loss in the right lung. Small right pleural effusion. Bilateral perihilar infiltrates may represent edema. Mild cardiac enlargement. Degenerative changes in the shoulders. IMPRESSION: Small right apical pneumothorax. Atelectasis in the right lung base. Small right pleural effusion. Bilateral perihilar infiltration suggesting edema or pneumonia. These results were called by telephone at the time of interpretation on 04/13/2015 at 9:30 pm to Dr. Myriam Jacobson, the patient's nurse on oncology 1C, who verbally acknowledged these results. Electronically Signed   By: Lucienne Capers M.D.   On: 04/13/2015 21:33   Ct Head Wo Contrast  04/13/2015  CLINICAL DATA:  Altered mental status. Hypoxia. Nonresponsive today. EXAM: CT HEAD WITHOUT CONTRAST TECHNIQUE: Contiguous axial images were obtained from the base of the skull through the vertex without intravenous contrast. COMPARISON:  MRI brain 08/07/2014.  CT head 08/06/2014. FINDINGS: The mostly cystic mass demonstrated in the left posterior periventricular region and extending into the posterior corpus callosum across the midline. This is likely represent primary or metastatic disease. The lesion is smaller than on previous study and previous areas of increased density have significantly decreased. The lesion today measures about 2.5 x 3.8 cm. There is only a small residual area of increased density adjacent to the left lateral ventricle measuring about 4 mm. This could be related to the tumor may indicate an area of hemorrhage. It appears to been present previously. There is mild white matter edema in the left parietal region. No significant midline shift. There is displacement of the left lateral ventricle with mild dilatation of the posterior horn of the left lateral ventricle. No additional intracranial lesions are identified. Mild diffuse atrophy. Low-attenuation changes in the deep white matter consistent with small vessel  ischemia. No abnormal extra-axial fluid collections. Gray-white matter junctions are distinct. Basal cisterns are not effaced. Postoperative changes with left posterior parietal craniotomy and mesh closure. No depressed skull fractures. Visualized paranasal sinuses are not opacified. IMPRESSION: Decreased size of the cystic mass in the left posterior periventricular region. Small focal area of increased density could represent calcification or hemorrhage. Areas of increased density are decreased since previous study and likely related to the known tumor. White matter edema in the left parietal lobe. No definite evidence of any acute changes. Electronically Signed   By: Lucienne Capers M.D.   On: 04/13/2015 21:47   US Abdomen Complete  04/14/2015  CLINICAL DATA:  One week of right-sided abdominal pain, history of gallbladder malignancy status post cholecystectomy; the patient is unable to cooperate with breath hold techniques or to move the side side and is in considerable pain EXAM: ULTRASOUND ABDOMEN COMPLETE COMPARISON:  PET-CT study of February 25, 2015 FINDINGS: Gallbladder: The gallbladder is surgically absent Common bile duct: Diameter: 2 mm Liver: In the right hepatic lobe there is a 1.3 x 1 x 1.4 cm hypoechoic structure that is not clearly a cyst. There is gas in the biliary tree. The left lobe hepatic ducts are mildly prominent. IVC: Within the inferior vena cava there are echoes there is echogenic material worrisome for either bland or tumor thrombus. This is not completely obstructing the IVC. Pancreas: Bowel gas and the patient's clinical condition limits evaluation of the pancreatic tail. The pancreatic head and body are grossly normal where visualized. Spleen: Size and appearance within normal limits. Right Kidney: Length: 10.9 cm. Echogenicity within normal limits. No mass or hydronephrosis visualized. Left Kidney: Length: 11.0 cm. Echogenicity within normal limits. No mass or hydronephrosis  visualized. Abdominal aorta: No aortic aneurysm is demonstrated. The iliac arteries were obscured by bowel gas. Other findings: No ascites is demonstrated. There is a right pleural effusion containing echogenic material. IMPRESSION: 1. Thrombus either bland or tumor in nature is present in the IVC. 2. Hypoechoic mass in the right hepatic lobe measuring 1.4 cm in greatest dimension. There is air in the biliary tree which not unexpected post cholecystectomy and sphincterotomy. There is mild prominence of the left lobe hepatic ducts. 3. There is a right pleural effusion containing debris. 4. The kidneys, abdominal aorta, and spleen are unremarkable. Electronically Signed   By: Helen Winterhalter  [place]  M.D.   On: 04/14/2015 10:12    Assessment:   Marcia Collins is a 69 y.o. 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 Metro Kung office for worsening right flank pain and altered mental status. She was also reported to have decreased oral intake. She was noted to be in acute renal failure, elevated WBC count (37K) and her blood cultures from admission are growing gram-negative rods - Ided as E coli. UCX growing Klebsiella. UA on admission had only 0-5 wbc but had wbc clumps present. Abd USS shows a liver lesion and air in biliary tree. T bili is elevated at 4- had been nml 1 month ago.  Patient's mental status hasn't improved in the last day, remains in renal failure.  I suspect a biliary source of infection given the air in biliary tree and possible liver lesion (? Abscess), although has had pneumobilia in prior imaging.   ABX  Cipro 12/14-15 Meropenem 12/15->>  Recommendations Continue meropenem pending sensis of the E coli Over weekend can tailor abx back based on culture but would continue to cover anaerobes as well since possible biliary  source Poor prognosis. Agree with palliative care.  Thank you very much for allowing me to participate in the care of this patient. Please call with questions.   Cheral Marker. Ola Spurr, MD

## 2015-04-15 DIAGNOSIS — G9349 Other encephalopathy: Secondary | ICD-10-CM

## 2015-04-15 DIAGNOSIS — R400 Somnolence: Secondary | ICD-10-CM

## 2015-04-15 LAB — CULTURE, BLOOD (ROUTINE X 2)

## 2015-04-15 MED ORDER — MORPHINE SULFATE (PF) 2 MG/ML IV SOLN
2.0000 mg | INTRAVENOUS | Status: DC | PRN
  Administered 2015-04-15 (×6): 2 mg via INTRAVENOUS
  Filled 2015-04-15 (×6): qty 1

## 2015-04-15 MED ORDER — FUROSEMIDE 10 MG/ML IJ SOLN
INTRAMUSCULAR | Status: AC
  Administered 2015-04-15: 40 mg via INTRAVENOUS
  Filled 2015-04-15: qty 4

## 2015-04-15 MED ORDER — FUROSEMIDE 10 MG/ML IJ SOLN
40.0000 mg | Freq: Once | INTRAMUSCULAR | Status: AC
Start: 2015-04-15 — End: 2015-04-15
  Administered 2015-04-15: 40 mg via INTRAVENOUS

## 2015-04-15 MED ORDER — MORPHINE 100MG IN NS 100ML (1MG/ML) PREMIX INFUSION
1.0000 mg/h | INTRAVENOUS | Status: DC
  Administered 2015-04-15: 11:00:00 1 mg/h via INTRAVENOUS
  Filled 2015-04-15: qty 100

## 2015-04-15 MED ORDER — GLYCOPYRROLATE 0.2 MG/ML IJ SOLN
0.1000 mg | INTRAMUSCULAR | Status: DC | PRN
  Administered 2015-04-15: 11:00:00 0.1 mg via INTRAVENOUS
  Filled 2015-04-15: qty 1

## 2015-04-15 MED ORDER — MORPHINE SULFATE 25 MG/ML IV SOLN
1.0000 mg/h | INTRAVENOUS | Status: DC
  Filled 2015-04-15: qty 10

## 2015-04-30 NOTE — Progress Notes (Signed)
   04/18/15 0330  Clinical Encounter Type  Visited With Patient and family together  Visit Type Initial;Spiritual support;Patient actively dying  Referral From Nurse  Consult/Referral To Chaplain  Paged to room because patient is actively dying. Spent time with 3 sons who shared stories about their mom. Will check later in morning as patient moves closer to dying. [place]

## 2015-04-30 NOTE — Progress Notes (Signed)
[facility] Hematology/Oncology Progress Note  Date of admission: 04/17/2015  Hospital day:  2015-05-01  Chief Complaint: Marcia Collins is a 70 y.o. female with metastatic cholangiocarcinoma who was admitted with altered mental status, acute renal insufficiency, and right upper quadrant pain.  Subjective: Patient unable to verbalize any complaints.  Social History: The patient is accompanied by one of her sons and her husband.  In the day room next door, are 7 family members and friends with whom I spoke.  Allergies: No Known Allergies  Scheduled Medications:    Review of Systems: Unable to be performed as patient unresponsive.  Physical Exam: Blood pressure 164/133, pulse 96, temperature 98.8 F (37.1 C), temperature source Axillary, resp. rate 28, height 5' 8"  (1.727 m), weight 180 lb 3.2 oz (81.738 kg), SpO2 75 %.  GENERAL:  Chronically ill appearing woman lying in bed with agonal respirations. MENTAL STATUS:  Unresponsive. HEAD:  Short gray hair.  Normocephalic, atraumatic, face symmetric, no Cushingoid features. EYES:  Pupils equal round.  Scleral icterus. ENT:  Oropharynx dry and without lesion.  Tongue normal.  RESPIRATORY:  Decreased breath sounds RUL.  Coarse breath sounds bilaterally.  No wheezes.   CARDIOVASCULAR:  Regular rate and rhythm without murmur, rub or gallop. ABDOMEN:  Soft, with active bowel sounds and no hepatosplenomegaly.  No masses. SKIN:  No rashes, ulcers or lesions. EXTREMITIES: No edema, no skin discoloration or tenderness.  No palpable cords. LYMPH NODES: No palpable cervical, supraclavicular, axillary or inguinal adenopathy  NEUROLOGICAL: Unresponsive. PSYCH:  Unresponsive.  Results for orders placed or performed during the hospital encounter of 04/28/2015 (from the past 48 hour(s))  CBC with Differential/Platelet     Status: Abnormal   Collection Time: 04/14/15  5:07 AM  Result Value Ref Range   WBC 19.0 (H) 3.6 - 11.0  K/uL   RBC 2.78 (L) 3.80 - 5.20 MIL/uL   Hemoglobin 7.7 (L) 12.0 - 16.0 g/dL   HCT 23.3 (L) 35.0 - 47.0 %   MCV 83.9 80.0 - 100.0 fL   MCH 27.6 26.0 - 34.0 pg   MCHC 33.0 32.0 - 36.0 g/dL   RDW 16.9 (H) 11.5 - 14.5 %   Platelets 51 (L) 150 - 440 K/uL   Neutrophils Relative % 97% %   Neutro Abs 18.3 (H) 1.4 - 6.5 K/uL   Lymphocytes Relative 1% %   Lymphs Abs 0.2 (L) 1.0 - 3.6 K/uL   Monocytes Relative 2% %   Monocytes Absolute 0.4 0.2 - 0.9 K/uL   Eosinophils Relative 0% %   Eosinophils Absolute 0.0 0 - 0.7 K/uL   Basophils Relative 0% %   Basophils Absolute 0.0 0 - 0.1 K/uL  Comprehensive metabolic panel     Status: Abnormal   Collection Time: 04/14/15  5:07 AM  Result Value Ref Range   Sodium 144 135 - 145 mmol/L   Potassium 4.6 3.5 - 5.1 mmol/L   Chloride 117 (H) 101 - 111 mmol/L   CO2 24 22 - 32 mmol/L   Glucose, Bld 153 (H) 65 - 99 mg/dL   BUN 73 (H) 6 - 20 mg/dL   Creatinine, Ser 1.37 (H) 0.44 - 1.00 mg/dL   Calcium 8.2 (L) 8.9 - 10.3 mg/dL   Total Protein 5.8 (L) 6.5 - 8.1 g/dL   Albumin 1.6 (L) 3.5 - 5.0 g/dL   AST 24 15 - 41 U/L   ALT 24 14 - 54 U/L   Alkaline Phosphatase 90 38 - 126  U/L   Total Bilirubin 4.6 (H) 0.3 - 1.2 mg/dL   GFR calc non Af Amer 38 (L) >60 mL/min   GFR calc Af Amer 44 (L) >60 mL/min    Comment: (NOTE) The eGFR has been calculated using the CKD EPI equation. This calculation has not been validated in all clinical situations. eGFR's persistently <60 mL/min signify possible Chronic Kidney Disease.    Anion gap 3 (L) 5 - 15   Dg Chest 2 View  04/13/2015  CLINICAL DATA:  Hypoxia. EXAM: CHEST  2 VIEW COMPARISON:  PET-CT scan 01/2015.  Chest radiograph 01/08/2013. FINDINGS: There is a central venous catheter with tip over the cavoatrial junction region. There is a small Right apical pneumothorax. This extends to about the level of the fourth rib, measuring about 3.1 cm at the apex. There is diffuse infiltration and volume loss in the right lung.  Small right pleural effusion. Bilateral perihilar infiltrates may represent edema. Mild cardiac enlargement. Degenerative changes in the shoulders. IMPRESSION: Small right apical pneumothorax. Atelectasis in the right lung base. Small right pleural effusion. Bilateral perihilar infiltration suggesting edema or pneumonia. These results were called by telephone at the time of interpretation on 04/13/2015 at 9:30 pm to Dr. Myriam Jacobson, the patient's nurse on oncology 1C, who verbally acknowledged these results. Electronically Signed   By: Lucienne Capers M.D.   On: 04/13/2015 21:33   Ct Head Wo Contrast  04/13/2015  CLINICAL DATA:  Altered mental status. Hypoxia. Nonresponsive today. EXAM: CT HEAD WITHOUT CONTRAST TECHNIQUE: Contiguous axial images were obtained from the base of the skull through the vertex without intravenous contrast. COMPARISON:  MRI brain 08/07/2014.  CT head 08/06/2014. FINDINGS: The mostly cystic mass demonstrated in the left posterior periventricular region and extending into the posterior corpus callosum across the midline. This is likely represent primary or metastatic disease. The lesion is smaller than on previous study and previous areas of increased density have significantly decreased. The lesion today measures about 2.5 x 3.8 cm. There is only a small residual area of increased density adjacent to the left lateral ventricle measuring about 4 mm. This could be related to the tumor may indicate an area of hemorrhage. It appears to been present previously. There is mild white matter edema in the left parietal region. No significant midline shift. There is displacement of the left lateral ventricle with mild dilatation of the posterior horn of the left lateral ventricle. No additional intracranial lesions are identified. Mild diffuse atrophy. Low-attenuation changes in the deep white matter consistent with small vessel ischemia. No abnormal extra-axial fluid collections. Gray-white matter  junctions are distinct. Basal cisterns are not effaced. Postoperative changes with left posterior parietal craniotomy and mesh closure. No depressed skull fractures. Visualized paranasal sinuses are not opacified. IMPRESSION: Decreased size of the cystic mass in the left posterior periventricular region. Small focal area of increased density could represent calcification or hemorrhage. Areas of increased density are decreased since previous study and likely related to the known tumor. White matter edema in the left parietal lobe. No definite evidence of any acute changes. Electronically Signed   By: Lucienne Capers M.D.   On: 04/13/2015 21:47   US Abdomen Complete  04/14/2015  CLINICAL DATA:  One week of right-sided abdominal pain, history of gallbladder malignancy status post cholecystectomy; the patient is unable to cooperate with breath hold techniques or to move the side side and is in considerable pain EXAM: ULTRASOUND ABDOMEN COMPLETE COMPARISON:  PET-CT study of February 25, 2015  FINDINGS: Gallbladder: The gallbladder is surgically absent Common bile duct: Diameter: 2 mm Liver: In the right hepatic lobe there is a 1.3 x 1 x 1.4 cm hypoechoic structure that is not clearly a cyst. There is gas in the biliary tree. The left lobe hepatic ducts are mildly prominent. IVC: Within the inferior vena cava there are echoes there is echogenic material worrisome for either bland or tumor thrombus. This is not completely obstructing the IVC. Pancreas: Bowel gas and the patient's clinical condition limits evaluation of the pancreatic tail. The pancreatic head and body are grossly normal where visualized. Spleen: Size and appearance within normal limits. Right Kidney: Length: 10.9 cm. Echogenicity within normal limits. No mass or hydronephrosis visualized. Left Kidney: Length: 11.0 cm. Echogenicity within normal limits. No mass or hydronephrosis visualized. Abdominal aorta: No aortic aneurysm is demonstrated. The iliac  arteries were obscured by bowel gas. Other findings: No ascites is demonstrated. There is a right pleural effusion containing echogenic material. IMPRESSION: 1. Thrombus either bland or tumor in nature is present in the IVC. 2. Hypoechoic mass in the right hepatic lobe measuring 1.4 cm in greatest dimension. There is air in the biliary tree which not unexpected post cholecystectomy and sphincterotomy. There is mild prominence of the left lobe hepatic ducts. 3. There is a right pleural effusion containing debris. 4. The kidneys, abdominal aorta, and spleen are unremarkable. Electronically Signed   By: David  Martinique M.D.   On: 04/14/2015 10:12   Dg Chest Port 1 View  04/14/2015  CLINICAL DATA:  Shortness of breath. EXAM: PORTABLE CHEST 1 VIEW COMPARISON:  Chest radiographs obtained yesterday at 2125 hour FINDINGS: Right pneumothorax, now moderate-large in size, with slight increase from prior. Right chest port remains in place. Persistent opacity at the right lung base, consistent pleural effusion. No evidence of mediastinal shift. Progressive multifocal opacities in the left perihilar and lower lung. IMPRESSION: 1. Increased size of right hydro pneumothorax, with increased pneumothorax component, now moderate to large in degree. There is no definite mediastinal shift. 2. Increased multifocal opacities in the left perihilar lung, may reflect pulmonary edema or pneumonia. These results were called by telephone at the time of interpretation on 04/14/2015 at 9:57 pm to [facility], nurse caring for the patient, who verbally acknowledged these results. Electronically Signed   By: Jeb Levering M.D.   On: 04/14/2015 21:59    Assessment:  Carnetta Losada is a 70 y.o. 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  She was initially diagnosed with stage I disease s/p resection in 05/2006.  She was diagnosed with multiple liver and bone metastasis in 12/2012.  She received oxaliplatin ans gemcitabine.  She underwent  resection of a right sided abdominal nodule in 01/2014.  She was diagnosed with brain metastasis in 08/2014 and received radiation.  Abdominal ultrasound on 04/14/2015 revealed no hydronephrosis.  There was a new 1.4 cm mass in the right lobe of the liver consistent with metastatic disease.  There was a bland or tumor thrombus in the IVC.  Head MRI was unable to be obtained on 04/14/2015 secondary to the patient's agitation.  Overnight, patient had a respiratory decompensation.  CXR revealed moderate-large hydro-pneumothroax (increased).  There was increased multifocal opacities in the left perihilar lung (pneumonia versus edema).  Patient received lasix overnight with diuresis, but ongoing respiratory compromise.  She has been on a face mask and unresponsive.  Family has discussed current issues with surgery, hospitalist, and myself.  Decision made for no further aggressive measures and  comfort care only.  Plan: 1. Hematology/Oncology:  Stage IV cholangiocarcinoma with imaging studies revealing progressive disease.  Patient unresponsive.  Decision by family today for comfort care.  Patient to be started on a morphine drip.  Support provided family. 2. Infectious disease:  Antibiotics discontinued. 3. Renal:  IV fluid discontinued last night. 4. Pulmonary:  CXR as above.  Diuresis last night without improvement in respiratory status.  No intervention for pneumothorax. 5.   Neurology:  Encephalopathy secondary to infection/sepsis and/or brain metastasis.  Unable to perform head MRI. 6.   Disposition:  Code status is DNR/DNI.  Comfort care only.    Marcia Asal, MD  2015/05/04, 11:31 AM

## 2015-04-30 NOTE — Progress Notes (Signed)
[place] [place] NAME: Marcia Collins    MR#:  [id number]  DATE OF BIRTH:  07-05-1945  SUBJECTIVE:   Patient critically ill-appearing and in significant respiratory distress. Patient was made DO NOT RESUSCITATE/comfort care only earlier this morning.   REVIEW OF SYSTEMS:    Review of Systems  Unable to perform ROS: acuity of condition   Nutrition: NPO Tolerating Diet: NO  DRUG ALLERGIES:  No Known Allergies  VITALS:  Blood pressure 164/133, pulse 96, temperature 98.8 F (37.1 C), temperature source Axillary, resp. rate 28, height 5\' 8"  (1.727 m), weight 81.738 kg (180 lb 3.2 oz), SpO2 75 %.  PHYSICAL EXAMINATION:   Physical Exam  GENERAL:  70 y.o.-year-old patient lying in the bed in moderate resp. Distress and critically ill appearing.  EYES:No scleral icterus.  HEENT: Head atraumatic, normocephalic.  NECK:  Supple, no jugular venous distention. No thyroid enlargement, no tenderness.  LUNGS: No rales, rhonchi, wheezes. Positive use of accessory muscles.  CARDIOVASCULAR: S1, S2 normal. No murmurs, rubs, or gallops.  ABDOMEN: Soft, nontender, nondistended. Bowel sounds present. No organomegaly or mass.  EXTREMITIES: No cyanosis, clubbing or edema b/l.    NEUROLOGIC: Encephalopathic, critically ill appearing. PSYCHIATRIC: Encephalopathic/lethargic and sedated.  SKIN: No obvious rash, lesion, or ulcer.    LABORATORY PANEL:   CBC  Recent Labs Lab 04/14/15 0507  WBC 19.0*  HGB 7.7*  HCT 23.3*  PLT 51*   ------------------------------------------------------------------------------------------------------------------  Chemistries   Recent Labs Lab 04/06/2015 1712  04/14/15 0507  NA 132*  < > 144  K 4.8  < > 4.6  CL 99*  < > 117*  CO2 21*  < > 24  GLUCOSE 145*  < > 153*  BUN 100*  < > 73*  CREATININE 2.67*  < > 1.37*  CALCIUM 8.2*  < > 8.2*  MG 2.5*  --   --   AST 28  < > 24  ALT 23  < > 24  ALKPHOS 121   < > 90  BILITOT 5.1*  < > 4.6*  < > = values in this interval not displayed. ------------------------------------------------------------------------------------------------------------------  Cardiac Enzymes No results for input(s): TROPONINI in the last 168 hours. ------------------------------------------------------------------------------------------------------------------  RADIOLOGY:  Dg Chest 2 View  04/13/2015  CLINICAL DATA:  Hypoxia. EXAM: CHEST  2 VIEW COMPARISON:  PET-CT scan 01/2015.  Chest radiograph 01/08/2013. FINDINGS: There is a central venous catheter with tip over the cavoatrial junction region. There is a small Right apical pneumothorax. This extends to about the level of the fourth rib, measuring about 3.1 cm at the apex. There is diffuse infiltration and volume loss in the right lung. Small right pleural effusion. Bilateral perihilar infiltrates may represent edema. Mild cardiac enlargement. Degenerative changes in the shoulders. IMPRESSION: Small right apical pneumothorax. Atelectasis in the right lung base. Small right pleural effusion. Bilateral perihilar infiltration suggesting edema or pneumonia. These results were called by telephone at the time of interpretation on 04/13/2015 at 9:30 pm to Dr. Myriam Jacobson, the patient's nurse on oncology 1C, who verbally acknowledged these results. Electronically Signed   By: Lucienne Capers M.D.   On: 04/13/2015 21:33   Ct Head Wo Contrast  04/13/2015  CLINICAL DATA:  Altered mental status. Hypoxia. Nonresponsive today. EXAM: CT HEAD WITHOUT CONTRAST TECHNIQUE: Contiguous axial images were obtained from the base of the skull through the vertex without intravenous contrast. COMPARISON:  MRI brain 08/07/2014.  CT head 08/06/2014. FINDINGS: The mostly cystic  mass demonstrated in the left posterior periventricular region and extending into the posterior corpus callosum across the midline. This is likely represent primary or metastatic disease.  The lesion is smaller than on previous study and previous areas of increased density have significantly decreased. The lesion today measures about 2.5 x 3.8 cm. There is only a small residual area of increased density adjacent to the left lateral ventricle measuring about 4 mm. This could be related to the tumor may indicate an area of hemorrhage. It appears to been present previously. There is mild white matter edema in the left parietal region. No significant midline shift. There is displacement of the left lateral ventricle with mild dilatation of the posterior horn of the left lateral ventricle. No additional intracranial lesions are identified. Mild diffuse atrophy. Low-attenuation changes in the deep white matter consistent with small vessel ischemia. No abnormal extra-axial fluid collections. Gray-white matter junctions are distinct. Basal cisterns are not effaced. Postoperative changes with left posterior parietal craniotomy and mesh closure. No depressed skull fractures. Visualized paranasal sinuses are not opacified. IMPRESSION: Decreased size of the cystic mass in the left posterior periventricular region. Small focal area of increased density could represent calcification or hemorrhage. Areas of increased density are decreased since previous study and likely related to the known tumor. White matter edema in the left parietal lobe. No definite evidence of any acute changes. Electronically Signed   By: Lucienne Capers M.D.   On: 04/13/2015 21:47   US Abdomen Complete  04/14/2015  CLINICAL DATA:  One week of right-sided abdominal pain, history of gallbladder malignancy status post cholecystectomy; the patient is unable to cooperate with breath hold techniques or to move the side side and is in considerable pain EXAM: ULTRASOUND ABDOMEN COMPLETE COMPARISON:  PET-CT study of February 25, 2015 FINDINGS: Gallbladder: The gallbladder is surgically absent Common bile duct: Diameter: 2 mm Liver: In the right  hepatic lobe there is a 1.3 x 1 x 1.4 cm hypoechoic structure that is not clearly a cyst. There is gas in the biliary tree. The left lobe hepatic ducts are mildly prominent. IVC: Within the inferior vena cava there are echoes there is echogenic material worrisome for either bland or tumor thrombus. This is not completely obstructing the IVC. Pancreas: Bowel gas and the patient's clinical condition limits evaluation of the pancreatic tail. The pancreatic head and body are grossly normal where visualized. Spleen: Size and appearance within normal limits. Right Kidney: Length: 10.9 cm. Echogenicity within normal limits. No mass or hydronephrosis visualized. Left Kidney: Length: 11.0 cm. Echogenicity within normal limits. No mass or hydronephrosis visualized. Abdominal aorta: No aortic aneurysm is demonstrated. The iliac arteries were obscured by bowel gas. Other findings: No ascites is demonstrated. There is a right pleural effusion containing echogenic material. IMPRESSION: 1. Thrombus either bland or tumor in nature is present in the IVC. 2. Hypoechoic mass in the right hepatic lobe measuring 1.4 cm in greatest dimension. There is air in the biliary tree which not unexpected post cholecystectomy and sphincterotomy. There is mild prominence of the left lobe hepatic ducts. 3. There is a right pleural effusion containing debris. 4. The kidneys, abdominal aorta, and spleen are unremarkable. Electronically Signed   By: David  Martinique M.D.   On: 04/14/2015 10:12   Dg Chest Port 1 View  04/14/2015  CLINICAL DATA:  Shortness of breath. EXAM: PORTABLE CHEST 1 VIEW COMPARISON:  Chest radiographs obtained yesterday at 2125 hour FINDINGS: Right pneumothorax, now moderate-large in size, with slight  increase from prior. Right chest port remains in place. Persistent opacity at the right lung base, consistent pleural effusion. No evidence of mediastinal shift. Progressive multifocal opacities in the left perihilar and lower lung.  IMPRESSION: 1. Increased size of right hydro pneumothorax, with increased pneumothorax component, now moderate to large in degree. There is no definite mediastinal shift. 2. Increased multifocal opacities in the left perihilar lung, may reflect pulmonary edema or pneumonia. These results were called by telephone at the time of interpretation on 04/14/2015 at 9:57 pm to [facility], nurse caring for the patient, who verbally acknowledged these results. Electronically Signed   By: Jeb Levering M.D.   On: 04/14/2015 21:59     ASSESSMENT AND PLAN:   70 year old female with past medical history of stage IV cholangiocarcinoma, adult failure to thrive, brain metastases who presented to the hospital due to flank pain and altered mental status and noted to be septic.  #1 severe sepsis-this is likely secondary to ESBL/Klebsiella bacteremia. The source is unclear. #2 acute respiratory failure with hypoxia-chest x-ray noted to be severe and apical pneumothorax -Continue O2 supplementation and supportive care. #3 acute encephalopathy-this is likely metabolic encephalopathy secondary to the sepsis and respiratory failure. #4 acute renal failure-this is likely ATN secondary to sepsis. #5 stage IV cholangiocarcinoma with metastatic disease to the brain-patient ultrasound also showed a IVC thrombus but patient is not a candidate for anticoagulation given her anemia/thrombocytopenia.  Patient has multiorgan disease and a very poor prognosis given her advanced stage IV cholangiocarcinoma. I had an extensive discussion with the patient's sons this morning about her poor prognosis and they want to keep her comfortable and not pursue any further aggressive care.  Patient was therefore made comfort care only and started on a morphine drip, as needed Ativan and glycopyrrolate for secretions. Nursing staff was notified.   All the records are reviewed and case discussed with Care Management/Social Workerr. Management plans  discussed with the patient, family and they are in agreement.  CODE STATUS: DO NOT RESUSCITATE  TOTAL TIME TAKING CARE OF THIS PATIENT: 30 minutes.   POSSIBLE D/C unclear, DEPENDING ON CLINICAL CONDITION.  Greater than 50% of time spent in coordination of care and discussion of patient's prognosis and plan of care with the patient's family.   Henreitta Leber M.D on 08-May-2015 at 2:54 PM  Between 7am to 6pm - Pager - [phone]  After 6pm go to [email or web address] - password [email or web address] [facility]  [facility]  Office  [phone]  CC: Primary care physician; Otilio Miu, MD

## 2015-04-30 NOTE — Progress Notes (Signed)
Reported to Dr Hamilton Capri ESBL in all blood cultures .

## 2015-04-30 NOTE — Discharge Summary (Signed)
Physician Discharge Summary  Patient ID: Marcia Collins MRN: [id number] DOB/AGE: 70-12-1945 70 y.o.  Admit date: 04/09/2015 Discharge date: 14-May-2015 Admission Diagnoses: Altered mental status tests Cholangiocarcinoma Right upper quadrant pain Discharge Diagnoses:  Active Problems:   Primary cholangiocarcinoma of bile duct (HCC)   Pressure ulcer   Severe sepsis (HCC)   Hypoxia   Spontaneous pneumothorax sepsis secondary to Escherichia coli. 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  Pre  renal failure due to dehydration Discharged Condition: Patient expired Hospital Course: during hospital stay patient continued to be very agitated.  Blood culture and urine cultures were positive for Escherichia coli sepsis.  Was resistant to ciprofloxacin and was started on imipenem   remained very confused and disoriented.  MRI scan could not be done because of agitation and lack of cooperation. 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  Since condition continued to decline was seen bynephrologist, infectious disease specialist, and surgical evaluation for pneumothorax Patient expiredon December 17 Palliative care consult was also obtained and family agreed to Consults: Infectious disease specialist Palliative care Nephrologist Surgical service  Significant Diagnostic Studies:  Microbiology with blood culture and urine culture Ultrasound of abdomen and chest x-ray Treatments:  IV antibiotics IV fluid Sedation and pain control with IV morphine Discharge Exam: Blood pressure 164/133, pulse 96, temperature 98.8 F (37.1 C), temperature source Axillary, resp. rate 28, height 5\' 8"  (1.727 m), weight 180 lb (81.647 kg), SpO2 75 %. Patient's condition declined and  patient expired  Disposition: 20-Expired     Medication List    ASK your doctor about these medications        citalopram 20 MG tablet  Commonly known as:  CELEXA  Take 1 tablet (20 mg total) by mouth daily.     lisinopril 20 MG tablet  Commonly known as:  PRINIVIL,ZESTRIL  Take 1 tablet (20 mg total) by mouth daily.     predniSONE 20 MG tablet  Commonly known as:  DELTASONE  Take 1 tablet (20 mg total) by mouth daily with breakfast.     traMADol 50 MG tablet  Commonly known as:  ULTRAM  Take 1 tablet (50 mg total) by mouth daily.         SignedForest Gleason 04/18/2015, 1:19 PM

## 2015-04-30 NOTE — Progress Notes (Signed)
Patient pronounced dead at 1650 by Dr Hamilton Capri. Nursing Supervisor notified. Death reported to [place] at [facility], reference number is [id number].

## 2015-04-30 NOTE — Progress Notes (Signed)
Gen. surgery follow-up:  Patient's right-sided pneumothorax increased on serial chest x-rays Patient with increased work of breathing. However, given patient's multiple problems was made comfort care only by the family.  Had long conversation with the entire family about whether or not to place a right-sided chest tube. Provided with the risks, benefits, alternatives of placing a chest tube.  After this conversation the family was in agreement to not put the patient through the pain of the chest tube. They all understood that she would have continued work of breathing but they did not wish to prolong the inevitable.  Agreed with the family and stated we would follow their wishes. Encourage the family to be with the patient and each other at this time.  General surgery will sign off as the patient is being transitioned to pure comfort care. Please call us again if we can be of any further assistance.  Clayburn Pert, MD FACS General Surgeon [facility]

## 2015-04-30 NOTE — Progress Notes (Signed)
Called by nurse as pt had passed away and was not a Nurse may pronounce.  Pt. Was made comfort care only earlier today.   Pt. Had no heart, lung sounds, pupils fixed and Dilated and no chest rise noted.   Pt. Was pronounced at 4:50 p.m.   Family and nursing staff aware.

## 2015-04-30 DEATH — deceased

## 2015-05-02 ENCOUNTER — Inpatient Hospital Stay: Payer: Medicare Other

## 2015-05-23 ENCOUNTER — Ambulatory Visit: Payer: Medicare Other | Admitting: Oncology

## 2015-05-23 ENCOUNTER — Other Ambulatory Visit: Payer: Medicare Other

## 2015-06-13 ENCOUNTER — Inpatient Hospital Stay: Payer: Medicare Other

## 2015-07-07 ENCOUNTER — Encounter: Payer: Self-pay | Admitting: *Deleted

## 2015-10-16 ENCOUNTER — Other Ambulatory Visit: Payer: Self-pay | Admitting: Nurse Practitioner

## 2016-11-21 IMAGING — CT CT HEAD WITHOUT CONTRAST
1 series · 16 of 30 positions shown, 20 images · non-contrast
Comparison: CT scan of June 21, 2008.

CLINICAL DATA: Altered mental status for 2 weeks.

EXAM:
CT HEAD WITHOUT CONTRAST
TECHNIQUE: Contiguous axial images were obtained from the base of the skull
through the vertex without intravenous contrast.

[Series 2: head wo · axial · 0.45mm/px · z∈[-153,-27]mm · 16 of 32 slices shown, 20 images]
[im 2/32  brain]
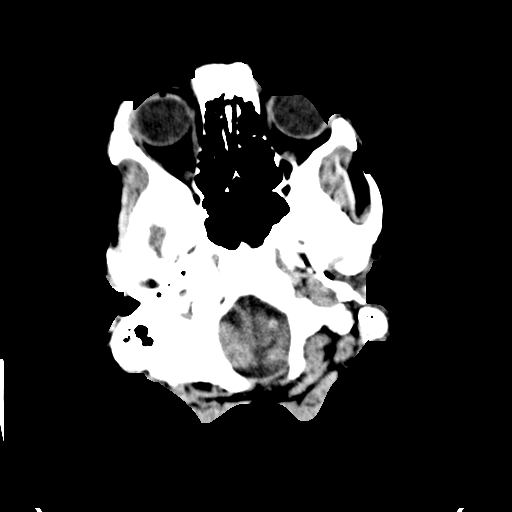
[im 2/32  bone]
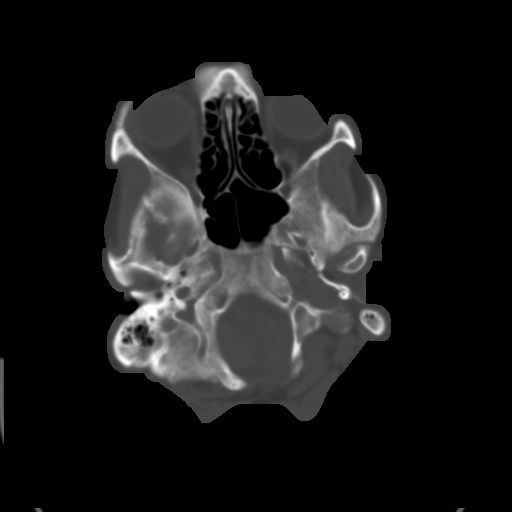
[im 4/32  brain]
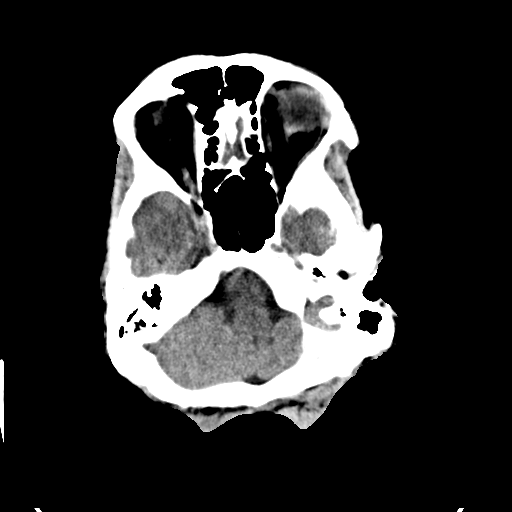
[im 6/32  brain]
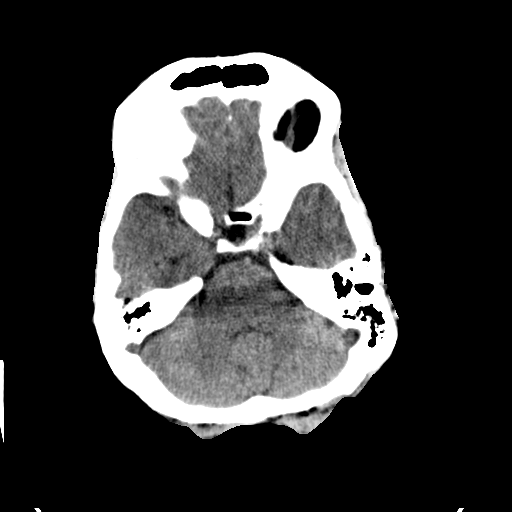
[im 8/32  brain]
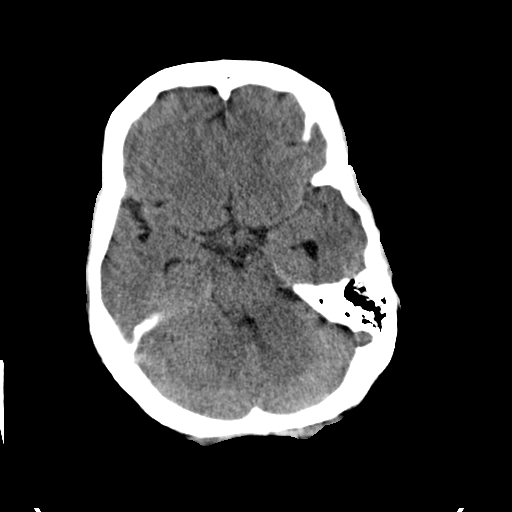
[im 9/32  brain]
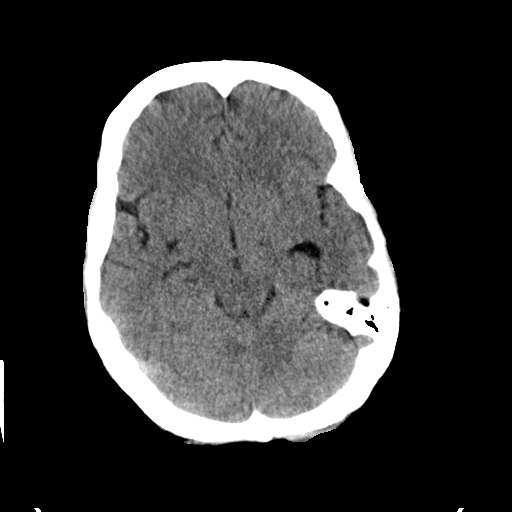
[im 9/32  bone]
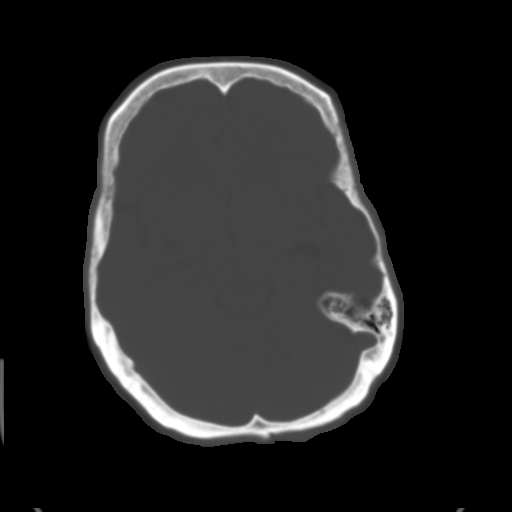
[im 11/32  brain]
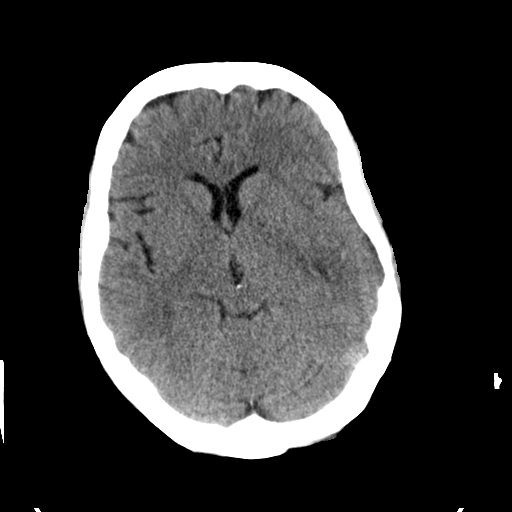
[im 13/32  brain]
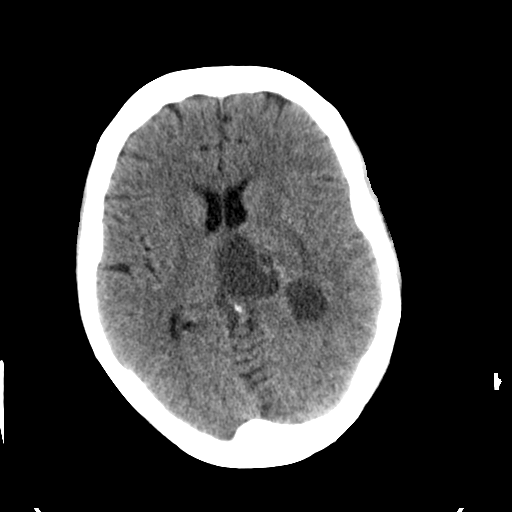
[im 15/32  brain]
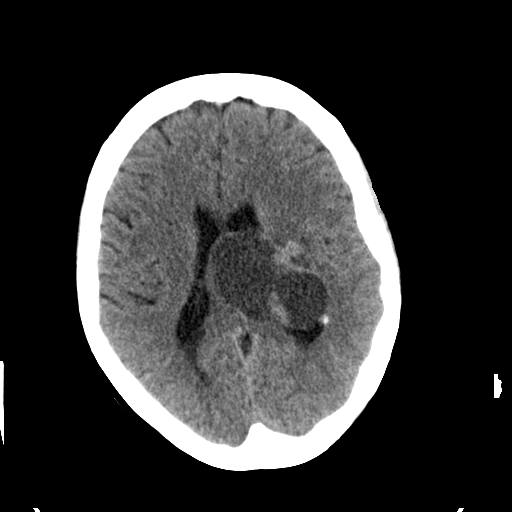
[im 17/32  brain]
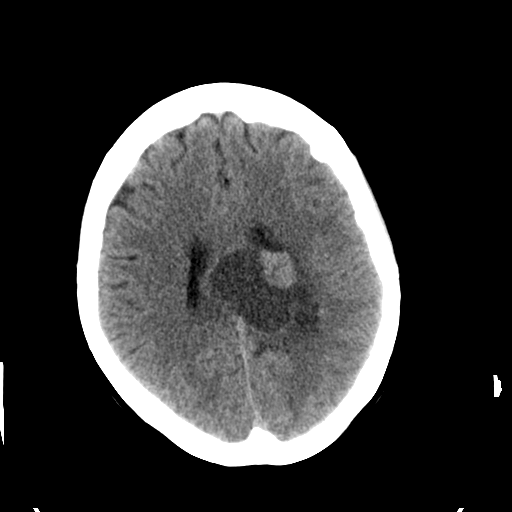
[im 17/32  bone]
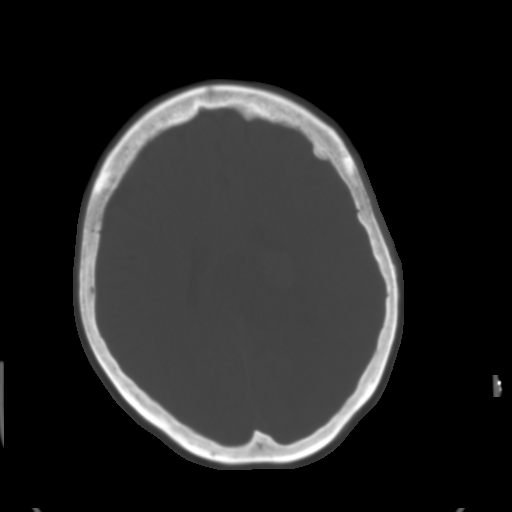
[im 19/32  brain]
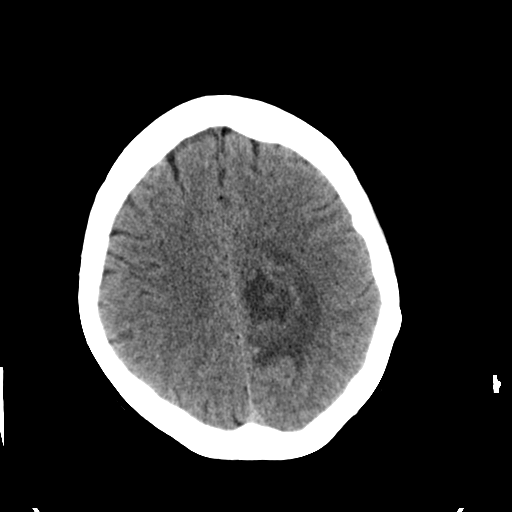
[im 21/32  brain]
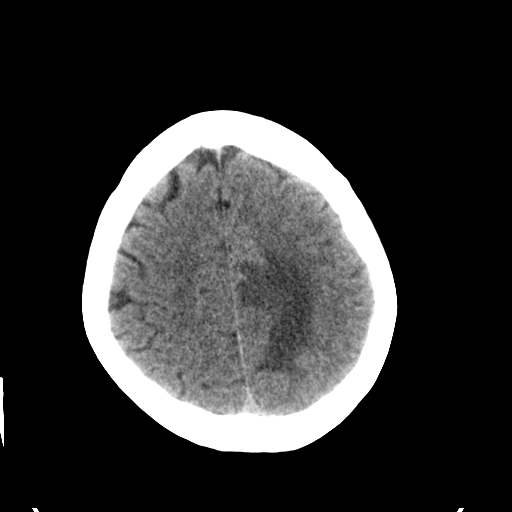
[im 23/32  brain]
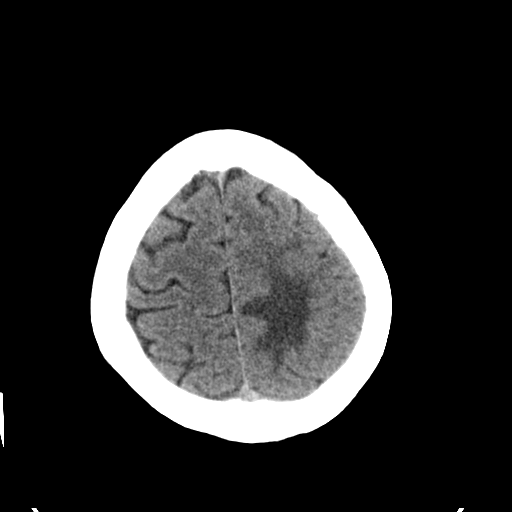
[im 24/32  brain]
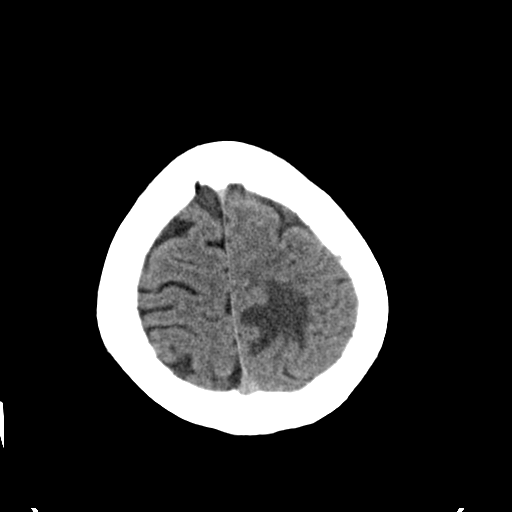
[im 24/32  bone]
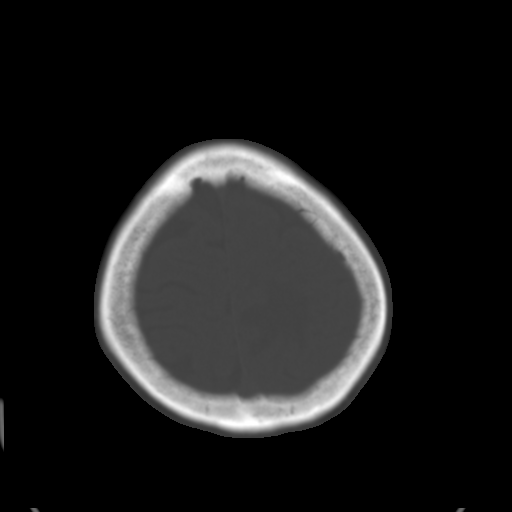
[im 26/32  brain]
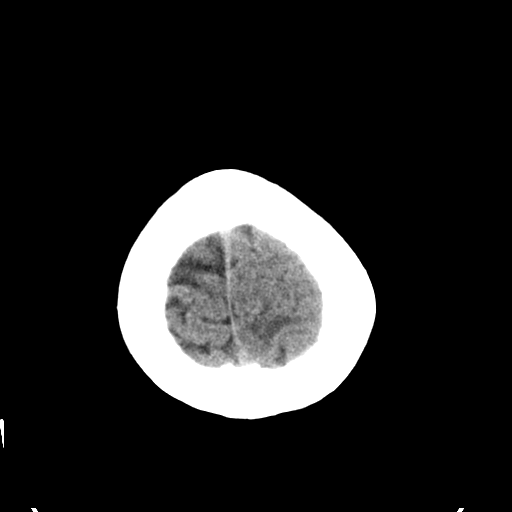
[im 28/32  brain]
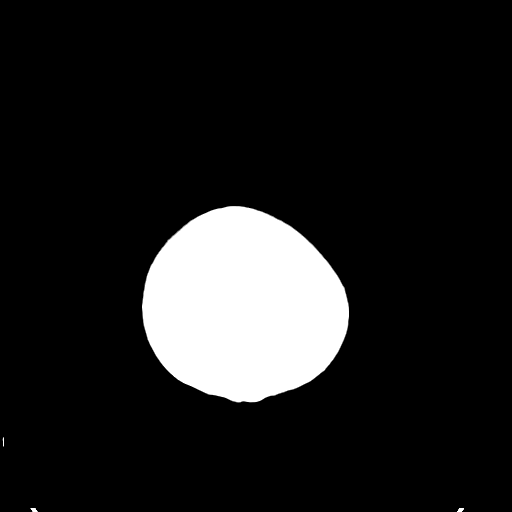
[im 30/32  brain]
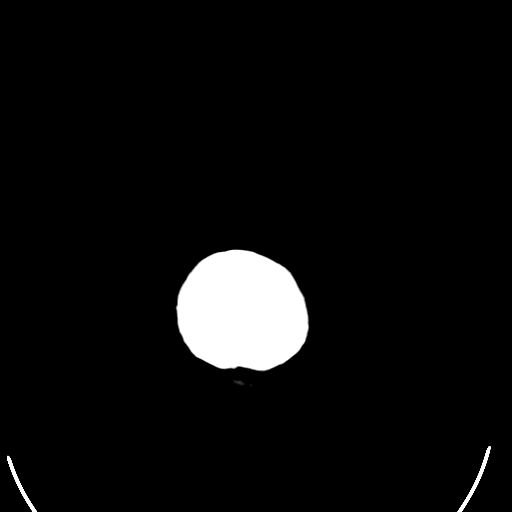

[16 of 30 positions shown; findings below may reference images not displayed]

FINDINGS: Bony calvarium appears intact. There is a complex mass now seen
above the left lateral ventricle and crossing the midline that
measures 5.8 x 3.8 cm. It is predominantly cystic with peripheral
solid components. No ventricular dilatation is seen at this time.
White matter edema is noted in the left centrum semiovale. There is
approximately 7 mm of left-to-right midline shift. No definite
hemorrhage or acute infarction is noted.
IMPRESSION: 5.8 x 3.8 cm complex mass is seen just above the left lateral
ventricle and crossing midline. It does not result in significant
ventricular dilatation, although mild left to right midline shift is
noted. MRI with and without gadolinium is recommended for further
evaluation.
